# Patient Record
Sex: Male | Born: 1965 | Hispanic: No | Marital: Single | Smoking: Current every day smoker
Health system: Southern US, Community
[De-identification: ages and names within clinical notes are randomized; demographics above are authoritative.]

---

## 2008-01-13 ENCOUNTER — Emergency Department (HOSPITAL_COMMUNITY): Admission: EM | Admit: 2008-01-13 | Discharge: 2008-01-14 | Payer: Self-pay | Admitting: Emergency Medicine

## 2016-03-04 ENCOUNTER — Other Ambulatory Visit (HOSPITAL_COMMUNITY): Payer: Self-pay | Admitting: Radiology

## 2016-03-04 ENCOUNTER — Emergency Department (HOSPITAL_COMMUNITY): Payer: BLUE CROSS/BLUE SHIELD

## 2016-03-04 ENCOUNTER — Encounter (HOSPITAL_COMMUNITY): Payer: Self-pay

## 2016-03-04 ENCOUNTER — Inpatient Hospital Stay (HOSPITAL_COMMUNITY)
Admission: EM | Admit: 2016-03-04 | Discharge: 2016-03-09 | DRG: 029 | Disposition: A | Discharge status: Rehabilitation Facility | Payer: BLUE CROSS/BLUE SHIELD | Attending: Neurosurgery | Admitting: Neurosurgery

## 2016-03-04 DIAGNOSIS — M4802 Spinal stenosis, cervical region: Secondary | ICD-10-CM | POA: Diagnosis present

## 2016-03-04 DIAGNOSIS — Z419 Encounter for procedure for purposes other than remedying health state, unspecified: Secondary | ICD-10-CM

## 2016-03-04 DIAGNOSIS — F10129 Alcohol abuse with intoxication, unspecified: Secondary | ICD-10-CM | POA: Diagnosis present

## 2016-03-04 DIAGNOSIS — K592 Neurogenic bowel, not elsewhere classified: Secondary | ICD-10-CM | POA: Diagnosis present

## 2016-03-04 DIAGNOSIS — E871 Hypo-osmolality and hyponatremia: Secondary | ICD-10-CM

## 2016-03-04 DIAGNOSIS — S14129A Central cord syndrome at unspecified level of cervical spinal cord, initial encounter: Principal | ICD-10-CM

## 2016-03-04 DIAGNOSIS — S12600A Unspecified displaced fracture of seventh cervical vertebra, initial encounter for closed fracture: Secondary | ICD-10-CM | POA: Diagnosis present

## 2016-03-04 DIAGNOSIS — I10 Essential (primary) hypertension: Secondary | ICD-10-CM | POA: Diagnosis present

## 2016-03-04 DIAGNOSIS — M4712 Other spondylosis with myelopathy, cervical region: Secondary | ICD-10-CM | POA: Diagnosis present

## 2016-03-04 DIAGNOSIS — W108XXA Fall (on) (from) other stairs and steps, initial encounter: Secondary | ICD-10-CM | POA: Diagnosis present

## 2016-03-04 DIAGNOSIS — Z23 Encounter for immunization: Secondary | ICD-10-CM

## 2016-03-04 DIAGNOSIS — S12501A Unspecified nondisplaced fracture of sixth cervical vertebra, initial encounter for closed fracture: Secondary | ICD-10-CM | POA: Diagnosis present

## 2016-03-04 DIAGNOSIS — R1310 Dysphagia, unspecified: Secondary | ICD-10-CM | POA: Diagnosis not present

## 2016-03-04 DIAGNOSIS — Y92511 Restaurant or cafe as the place of occurrence of the external cause: Secondary | ICD-10-CM

## 2016-03-04 DIAGNOSIS — S14129S Central cord syndrome at unspecified level of cervical spinal cord, sequela: Secondary | ICD-10-CM | POA: Diagnosis not present

## 2016-03-04 DIAGNOSIS — S14109A Unspecified injury at unspecified level of cervical spinal cord, initial encounter: Secondary | ICD-10-CM | POA: Diagnosis present

## 2016-03-04 DIAGNOSIS — G8918 Other acute postprocedural pain: Secondary | ICD-10-CM

## 2016-03-04 DIAGNOSIS — Y905 Blood alcohol level of 100-119 mg/100 ml: Secondary | ICD-10-CM | POA: Diagnosis present

## 2016-03-04 DIAGNOSIS — S14109S Unspecified injury at unspecified level of cervical spinal cord, sequela: Secondary | ICD-10-CM | POA: Diagnosis not present

## 2016-03-04 DIAGNOSIS — F17210 Nicotine dependence, cigarettes, uncomplicated: Secondary | ICD-10-CM | POA: Diagnosis present

## 2016-03-04 DIAGNOSIS — S14109D Unspecified injury at unspecified level of cervical spinal cord, subsequent encounter: Secondary | ICD-10-CM | POA: Diagnosis not present

## 2016-03-04 DIAGNOSIS — N319 Neuromuscular dysfunction of bladder, unspecified: Secondary | ICD-10-CM

## 2016-03-04 LAB — CBC WITH DIFFERENTIAL/PLATELET
Basophils Absolute: 0 10*3/uL (ref 0.0–0.1)
Basophils Relative: 0 %
EOS ABS: 0 10*3/uL (ref 0.0–0.7)
Eosinophils Relative: 0 %
HCT: 42.6 % (ref 39.0–52.0)
HEMOGLOBIN: 14.6 g/dL (ref 13.0–17.0)
LYMPHS ABS: 1.9 10*3/uL (ref 0.7–4.0)
Lymphocytes Relative: 29 %
MCH: 30.2 pg (ref 26.0–34.0)
MCHC: 34.3 g/dL (ref 30.0–36.0)
MCV: 88 fL (ref 78.0–100.0)
MONO ABS: 0.4 10*3/uL (ref 0.1–1.0)
MONOS PCT: 6 %
NEUTROS PCT: 65 %
Neutro Abs: 4.2 10*3/uL (ref 1.7–7.7)
Platelets: 209 10*3/uL (ref 150–400)
RBC: 4.84 MIL/uL (ref 4.22–5.81)
RDW: 13.5 % (ref 11.5–15.5)
WBC: 6.5 10*3/uL (ref 4.0–10.5)

## 2016-03-04 LAB — CREATININE, SERUM
Creatinine, Ser: 0.66 mg/dL (ref 0.61–1.24)
GFR calc Af Amer: >60 mL/min (ref >60)
GFR calc non Af Amer: >60 mL/min (ref >60)

## 2016-03-04 LAB — CBC
HEMATOCRIT: 42.6 % (ref 39.0–52.0)
HEMOGLOBIN: 14.4 g/dL (ref 13.0–17.0)
MCH: 30.1 pg (ref 26.0–34.0)
MCHC: 33.8 g/dL (ref 30.0–36.0)
MCV: 89.1 fL (ref 78.0–100.0)
Platelets: 203 10*3/uL (ref 150–400)
RBC: 4.78 MIL/uL (ref 4.22–5.81)
RDW: 13.6 % (ref 11.5–15.5)
WBC: 6.6 10*3/uL (ref 4.0–10.5)

## 2016-03-04 LAB — COMPREHENSIVE METABOLIC PANEL
ALT: 19 U/L (ref 17–63)
AST: 22 U/L (ref 15–41)
Albumin: 4 g/dL (ref 3.5–5.0)
Alkaline Phosphatase: 41 U/L (ref 38–126)
Anion gap: 11 (ref 5–15)
BUN: 9 mg/dL (ref 6–20)
CO2: 21 mmol/L — ABNORMAL LOW (ref 22–32)
Calcium: 8.6 mg/dL — ABNORMAL LOW (ref 8.9–10.3)
Chloride: 100 mmol/L — ABNORMAL LOW (ref 101–111)
Creatinine, Ser: 0.7 mg/dL (ref 0.61–1.24)
GFR calc Af Amer: >60 mL/min (ref >60)
GFR calc non Af Amer: >60 mL/min (ref >60)
Glucose, Bld: 124 mg/dL — ABNORMAL HIGH (ref 65–99)
Potassium: 4.4 mmol/L (ref 3.5–5.1)
Sodium: 132 mmol/L — ABNORMAL LOW (ref 135–145)
Total Bilirubin: 0.6 mg/dL (ref 0.3–1.2)
Total Protein: 6.9 g/dL (ref 6.5–8.1)

## 2016-03-04 LAB — ETHANOL: Alcohol, Ethyl (B): 119 mg/dL — ABNORMAL HIGH (ref <5)

## 2016-03-04 LAB — RAPID URINE DRUG SCREEN, HOSP PERFORMED
Amphetamines: NOT DETECTED
Barbiturates: NOT DETECTED
Benzodiazepines: NOT DETECTED
COCAINE: NOT DETECTED
OPIATES: NOT DETECTED
Tetrahydrocannabinol: NOT DETECTED

## 2016-03-04 MED ORDER — SODIUM CHLORIDE 0.9 % IV SOLN
INTRAVENOUS | Status: DC
  Administered 2016-03-04 – 2016-03-05 (×2): via INTRAVENOUS
  Administered 2016-03-05: 1000 mL via INTRAVENOUS
  Administered 2016-03-07 – 2016-03-09 (×4): via INTRAVENOUS

## 2016-03-04 MED ORDER — ONDANSETRON HCL 4 MG/2ML IJ SOLN: 4.0000 mg | Freq: Four times a day (QID) | INTRAMUSCULAR | Status: DC | PRN

## 2016-03-04 MED ORDER — SENNA 8.6 MG PO TABS
1.0000 | ORAL_TABLET | Freq: Two times a day (BID) | ORAL | Status: DC
  Administered 2016-03-04 – 2016-03-09 (×9): 8.6 mg via ORAL
  Filled 2016-03-04 (×11): qty 1

## 2016-03-04 MED ORDER — DOCUSATE SODIUM 100 MG PO CAPS
100.0000 mg | ORAL_CAPSULE | Freq: Two times a day (BID) | ORAL | Status: DC
  Administered 2016-03-04 – 2016-03-09 (×9): 100 mg via ORAL
  Filled 2016-03-04 (×10): qty 1

## 2016-03-04 MED ORDER — HEPARIN SODIUM (PORCINE) 5000 UNIT/ML IJ SOLN
5000.0000 [IU] | Freq: Three times a day (TID) | INTRAMUSCULAR | Status: DC
  Administered 2016-03-04 – 2016-03-09 (×14): 5000 [IU] via SUBCUTANEOUS
  Filled 2016-03-04 (×14): qty 1

## 2016-03-04 MED ORDER — HYDROCODONE-ACETAMINOPHEN 5-325 MG PO TABS
1.0000 | ORAL_TABLET | ORAL | Status: DC | PRN
  Administered 2016-03-04 – 2016-03-05 (×3): 2 via ORAL
  Administered 2016-03-05: 1 via ORAL
  Administered 2016-03-05 – 2016-03-09 (×9): 2 via ORAL
  Filled 2016-03-04 (×8): qty 2
  Filled 2016-03-04: qty 1
  Filled 2016-03-04 (×5): qty 2

## 2016-03-04 MED ORDER — FENTANYL CITRATE (PF) 100 MCG/2ML IJ SOLN
100.0000 ug | Freq: Once | INTRAMUSCULAR | Status: AC
  Administered 2016-03-04: 100 ug via INTRAVENOUS
  Filled 2016-03-04: qty 2

## 2016-03-04 MED ORDER — GABAPENTIN 300 MG PO CAPS
300.0000 mg | ORAL_CAPSULE | Freq: Two times a day (BID) | ORAL | Status: DC
  Administered 2016-03-04 – 2016-03-09 (×11): 300 mg via ORAL
  Filled 2016-03-04 (×11): qty 1

## 2016-03-04 MED ORDER — TETANUS-DIPHTH-ACELL PERTUSSIS 5-2.5-18.5 LF-MCG/0.5 IM SUSP
0.5000 mL | Freq: Once | INTRAMUSCULAR | Status: AC
  Administered 2016-03-04: 0.5 mL via INTRAMUSCULAR
  Filled 2016-03-04: qty 0.5

## 2016-03-04 MED ORDER — ONDANSETRON HCL 4 MG PO TABS: 4.0000 mg | ORAL_TABLET | Freq: Four times a day (QID) | ORAL | Status: DC | PRN

## 2016-03-04 NOTE — ED Notes (Addendum)
Dr. Freida BusmanAllen at bedside Dr. Freida BusmanAllen observed the pt walking and stating "it felt like I was learning to walk again".

## 2016-03-04 NOTE — ED Provider Notes (Addendum)
[facility]-EMERGENCY DEPT Provider Note   CSN: [id number] Arrival date & time: 03/04/16  4098  First Provider Contact:  First MD Initiated Contact with Patient 03/04/16 0210   By signing my name below, I, [name], attest that this documentation has been prepared under the direction and in the presence of Tomasita Crumble, MD. Electronically Signed: [name], ED Scribe. 03/04/16. 2:28 AM.  History   Chief Complaint Chief Complaint  Patient presents with  . Fall  . Alcohol Intoxication   LEVEL 5 CAVEAT: HPI and ROS limited due to AMS.  HPI Comments: Brian Petty is a 50 y.o. male who presents to the Emergency Department by EMS complaining of bilateral hand pain s/p mechanical injury. Pt was at a bar downtown and fell off barstool onto steps. No LOC. Pt is also noted to have a laceration to his nose, a chipped tooth, and headache. Bleeding controlled. Per EMS, pt was unresponsive at the scene but pt is currently somewhat responsive to verbal stimuli at this time. Pt had alcohol tonight, but is unaware how much. Pt has no other complaints at this time. Denies any additional injuries.   The history is provided by the patient and the EMS personnel. No language interpreter was used.    History reviewed. No pertinent past medical history.  There are no active problems to display for this patient.   History reviewed. No pertinent surgical history.     Home Medications    Prior to Admission medications   Not on File    Family History History reviewed. No pertinent family history.  Social History Social History  Substance Use Topics  . Smoking status: Not on file  . Smokeless tobacco: Not on file  . Alcohol use Yes     Allergies   Review of patient's allergies indicates not on file.   Review of Systems Review of Systems  Unable to perform ROS: Mental status change   Physical Exam Updated Vital Signs BP 94/62   Pulse 77   Temp 97.4 F (36.3 C)   Resp 16   SpO2 100%    Physical Exam  Constitutional: He is oriented to person, place, and time. Vital signs are normal. He appears well-developed and well-nourished.  Non-toxic appearance. He does not appear ill. No distress.  C-collar in place. Clinically intoxicated.  HENT:  Head: Normocephalic and atraumatic.  Nose: Nose normal.  Mouth/Throat: Oropharynx is clear and moist. No oropharyngeal exudate.  Eyes: Conjunctivae and EOM are normal. Pupils are equal, round, and reactive to light. No scleral icterus.  Neck: Normal range of motion. Neck supple. No tracheal deviation, no edema, no erythema and normal range of motion present. No thyroid mass and no thyromegaly present.  Cardiovascular: Normal rate, regular rhythm, S1 normal, S2 normal, normal heart sounds, intact distal pulses and normal pulses.  Exam reveals no gallop and no friction rub.   No murmur heard. Pulmonary/Chest: Effort normal and breath sounds normal. No respiratory distress. He has no wheezes. He has no rhonchi. He has no rales.  Abdominal: Soft. Normal appearance and bowel sounds are normal. He exhibits no distension, no ascites and no mass. There is no hepatosplenomegaly. There is no tenderness. There is no rebound, no guarding and no CVA tenderness.  Musculoskeletal: Normal range of motion. He exhibits edema. He exhibits no tenderness.  Half centimeter, horizontal laceration to nasal bridge with edema.  Lymphadenopathy:    He has no cervical adenopathy.  Neurological: He is alert and oriented to person, place,  and time. He has normal strength. No cranial nerve deficit or sensory deficit.  Skin: Skin is warm, dry and intact. No petechiae and no rash noted. He is not diaphoretic. No erythema. No pallor.  Nursing note and vitals reviewed.    ED Treatments / Results  DIAGNOSTIC STUDIES: Oxygen Saturation is 99% on RA, normal by my interpretation.    COORDINATION OF CARE: 2:15 AM Discussed treatment plan with pt at bedside and pt agreed to  plan.  Labs (all labs ordered are listed, but only abnormal results are displayed) Labs Reviewed - No data to display  EKG  EKG Interpretation None       Radiology Ct Head Wo Contrast  Result Date: 03/04/2016 CLINICAL DATA:  Initial evaluation for acute trauma, fall. EXAM: CT HEAD WITHOUT CONTRAST CT MAXILLOFACIAL WITHOUT CONTRAST CT CERVICAL SPINE WITHOUT CONTRAST TECHNIQUE: Multidetector CT imaging of the head, cervical spine, and maxillofacial structures were performed using the standard protocol without intravenous contrast. Multiplanar CT image reconstructions of the cervical spine and maxillofacial structures were also generated. COMPARISON:  None. FINDINGS: CT HEAD FINDINGS Scalp soft tissues demonstrate no acute abnormality. Visualized globes and orbits within normal limits. Nasal bone fractures noted, better evaluated on concomitant maxillofacial CT. Scattered mucosal thickening within the ethmoidal air cells. Paranasal sinuses are otherwise clear. Trace opacity left mastoid air cells without associated fracture. Right mastoid air cells clear. Calvarium intact. Mild age-related cerebral atrophy. No acute intracranial hemorrhage or infarct identified. No mass lesion, midline shift, or mass effect. No hydrocephalus. No extra-axial fluid collection. CT MAXILLOFACIAL FINDINGS Soft tissue swelling present about the nose. No other appreciable soft tissue swelling about the face. Globes intact. No retro-orbital hematoma or other pathology. Bony orbits intact without evidence orbital floor fracture. Zygomatic arches intact. No acute maxillary fracture. Pterygoid plates intact. Comminuted bilateral displaced nasal bone fractures present. Fracture of the anterior nasal septum. Mandible intact. Mandibular condyles normally situated. No appreciable abnormality about the dentition. Scattered mucosal thickening within the ethmoidal air cells. Paranasal sinuses are otherwise clear. CT CERVICAL SPINE  FINDINGS The vertebral bodies are normally aligned with preservation of the normal cervical lordosis. Vertebral body heights are preserved. Normal C1-2 articulations are intact. Small prevertebral effusion is present. A linear lucency is seen traversing the right lateral mass of C7 (series 15, image 23). This extends through the superior articular facet (series 14, image 22). While this could conceivably reflect a small nutrient foramen, an acute nondisplaced fracture should be considered com mild particularly given the presence of the small prevertebral effusion. There is age-indeterminate lucency through a prominent bulky osteophyte at the anterior margin of C6, favored to be chronic. No other acute fracture. Moderate multilevel degenerative spondylolysis extending from the C4-5 through C6-7 levels. Visualized lung apices are clear. No other acute soft tissue abnormality within the neck. Vascular calcifications about the left carotid bifurcation. IMPRESSION: CT HEAD: No acute intracranial process identified. CT MAXILLOFACIAL: 1. Acute comminuted bilateral nasal bone fractures with associated fracture of the anterior nasal septum. 2. No other acute maxillofacial injury identified. CT CERVICAL SPINE: 1. Linear lucency traversing the right lateral mass of C7, extending to the superior articular facet. While this finding could conceivably reflect a small nutrient foramen, possible acute nondisplaced fracture should also be considered. Correlation with physical exam for possible pain in this location recommended. 2. Small prevertebral effusion. 3. No other definite acute traumatic injury within the cervical spine. 4. Moderate multilevel degenerative spondylolysis from C4-5 through C6-7. Electronically Signed   By:  Rise Mu M.D.   On: 03/04/2016 03:57   Ct Cervical Spine Wo Contrast  Result Date: 03/04/2016 CLINICAL DATA:  Initial evaluation for acute trauma, fall. EXAM: CT HEAD WITHOUT CONTRAST CT  MAXILLOFACIAL WITHOUT CONTRAST CT CERVICAL SPINE WITHOUT CONTRAST TECHNIQUE: Multidetector CT imaging of the head, cervical spine, and maxillofacial structures were performed using the standard protocol without intravenous contrast. Multiplanar CT image reconstructions of the cervical spine and maxillofacial structures were also generated. COMPARISON:  None. FINDINGS: CT HEAD FINDINGS Scalp soft tissues demonstrate no acute abnormality. Visualized globes and orbits within normal limits. Nasal bone fractures noted, better evaluated on concomitant maxillofacial CT. Scattered mucosal thickening within the ethmoidal air cells. Paranasal sinuses are otherwise clear. Trace opacity left mastoid air cells without associated fracture. Right mastoid air cells clear. Calvarium intact. Mild age-related cerebral atrophy. No acute intracranial hemorrhage or infarct identified. No mass lesion, midline shift, or mass effect. No hydrocephalus. No extra-axial fluid collection. CT MAXILLOFACIAL FINDINGS Soft tissue swelling present about the nose. No other appreciable soft tissue swelling about the face. Globes intact. No retro-orbital hematoma or other pathology. Bony orbits intact without evidence orbital floor fracture. Zygomatic arches intact. No acute maxillary fracture. Pterygoid plates intact. Comminuted bilateral displaced nasal bone fractures present. Fracture of the anterior nasal septum. Mandible intact. Mandibular condyles normally situated. No appreciable abnormality about the dentition. Scattered mucosal thickening within the ethmoidal air cells. Paranasal sinuses are otherwise clear. CT CERVICAL SPINE FINDINGS The vertebral bodies are normally aligned with preservation of the normal cervical lordosis. Vertebral body heights are preserved. Normal C1-2 articulations are intact. Small prevertebral effusion is present. A linear lucency is seen traversing the right lateral mass of C7 (series 15, image 23). This extends  through the superior articular facet (series 14, image 22). While this could conceivably reflect a small nutrient foramen, an acute nondisplaced fracture should be considered com mild particularly given the presence of the small prevertebral effusion. There is age-indeterminate lucency through a prominent bulky osteophyte at the anterior margin of C6, favored to be chronic. No other acute fracture. Moderate multilevel degenerative spondylolysis extending from the C4-5 through C6-7 levels. Visualized lung apices are clear. No other acute soft tissue abnormality within the neck. Vascular calcifications about the left carotid bifurcation. IMPRESSION: CT HEAD: No acute intracranial process identified. CT MAXILLOFACIAL: 1. Acute comminuted bilateral nasal bone fractures with associated fracture of the anterior nasal septum. 2. No other acute maxillofacial injury identified. CT CERVICAL SPINE: 1. Linear lucency traversing the right lateral mass of C7, extending to the superior articular facet. While this finding could conceivably reflect a small nutrient foramen, possible acute nondisplaced fracture should also be considered. Correlation with physical exam for possible pain in this location recommended. 2. Small prevertebral effusion. 3. No other definite acute traumatic injury within the cervical spine. 4. Moderate multilevel degenerative spondylolysis from C4-5 through C6-7. Electronically Signed   By: Rise Mu M.D.   On: 03/04/2016 03:57   Mr Cervical Spine Wo Contrast  Result Date: 03/04/2016 CLINICAL DATA:  Initial evaluation for acute trauma, fall. Possible cervical spine fracture. Patient also with numbness and burning sensation in hands. EXAM: MRI CERVICAL SPINE WITHOUT CONTRAST TECHNIQUE: Multiplanar, multisequence MR imaging of the cervical spine was performed. No intravenous contrast was administered. COMPARISON:  Prior CT from earlier same day. FINDINGS: Alignment: Vertebral bodies normally  aligned with preservation of the normal cervical lordosis. No listhesis or subluxation. Vertebrae: Vertebral body heights are preserved. There is subtle edema within the  right articular process/lateral mass of C6, confirming previously question fracture. No malalignment. Additionally, there is abnormal edema extending through the anterior aspect of the superior endplate of C6. This corresponds with a prominent lucency very bulky osteophyte seen on prior CT at this level. This also likely reflects an acute fracture. Possible involvement of the anterior aspect of the C5-6 disc (series 4, image 8). No other definite acute fracture. Signal intensity within the vertebral body bone marrow is otherwise normal. Cord: There is patchy abnormal cord signal abnormality within the cervical spinal cord at the level of C5-6, suspicious for possible small cord contusion in the setting of acute trauma. Posterior Fossa, vertebral arteries, paraspinal tissues: Abnormal prevertebral edema extending from C3 through T2 T2. Possible disruption of the anterior longitudinal ligament at the level of C6 ligamentous structures otherwise intact. Mild edema within the suboccipital soft tissues as well. Disc levels: C2-C3: Small central this bulge mildly indents the ventral thecal sac without significant stenosis. Superimposed bilateral uncovertebral hypertrophy. Foramina remain patent. C3-C4: Mild diffuse disc bulge with bilateral uncovertebral hypertrophy, slightly worse on the right. Moderate right with more mild left foraminal stenosis. Broad-based posterior disc osteophyte flattens the ventral thecal sac with resultant mild canal stenosis. C4-C5: Diffuse disc bulge with bilateral uncovertebral spurring and posterior osseous ridging. Posterior disc osteophyte indents the ventral thecal sac and abuts the cervical spinal cord. Resultant moderate canal stenosis. Moderate right with mild left foraminal narrowing. C5-C6: Diffuse degenerative disc  bulge with bilateral uncovertebral spurring and posterior osseous ridging central/ right paracentral disc osteophyte complex flattens and effaces the ventral thecal sac and abuts the cervical spinal cord. Mild cord flattening, particularly on the right. Again, there is question of subtle cord edema at this level. Moderate canal stenosis. Moderate bilateral foraminal narrowing, right worse than left. C6-C7: Broad-based posterior disc protrusion with bilateral uncovertebral spurring. Protruding disc flattens and effaces the ventral thecal sac results in fairly severe canal stenosis. Severe bilateral foraminal narrowing present as well. C7-T1: Bilateral uncovertebral hypertrophy with minimal disc bulge. No significant canal stenosis. Mild foraminal narrowing. Visualized upper thoracic spine demonstrates no acute abnormality. IMPRESSION: 1. Subtle edema within the right lateral mass/articular process of C6, confirming previously questioned fracture at this location. 2. Edema extending through the anterior aspect of the superior endplate of C6, highly suspicious for an acute fracture extending through a bulky anterior osteophyte at this level. Possible extension into the anterior inferior aspect of the C5-6 disc. 3. Subtle edema within the cervical spinal cord at the level of C5-6, concerning for cord contusion. 4. Question acute injury if not frank disruption of the anterior longitudinal ligament at the C6 level. No other evidence for acute ligamentous injury. Associated abnormal prevertebral edema. 5. Multilevel degenerative spondylolysis as above, most severe at C6-7 were there is severe canal and bilateral foraminal stenosis. Electronically Signed   By: Rise Mu M.D.   On: 03/04/2016 06:59   Dg Hand Complete Left  Result Date: 03/04/2016 CLINICAL DATA:  Status post fall off bar stool, with left hand pain. Initial encounter. EXAM: LEFT HAND - COMPLETE 3+ VIEW COMPARISON:  None. FINDINGS: There is no  evidence of fracture or dislocation. The joint spaces are preserved. The carpal rows are intact, and demonstrate normal alignment. The soft tissues are unremarkable in appearance. IMPRESSION: No evidence of fracture or dislocation. Electronically Signed   By: Roanna Raider M.D.   On: 03/04/2016 03:27   Dg Hand Complete Right  Result Date: 03/04/2016 CLINICAL DATA:  Status  post fall off bar stool, with right hand pain. Initial encounter. EXAM: RIGHT HAND - COMPLETE 3+ VIEW COMPARISON:  None. FINDINGS: There is no evidence of fracture or dislocation. The joint spaces are preserved. The carpal rows are intact, and demonstrate normal alignment. The soft tissues are unremarkable in appearance. IMPRESSION: No evidence of fracture or dislocation. Electronically Signed   By: Roanna Raider M.D.   On: 03/04/2016 03:27   Ct Maxillofacial Wo Contrast  Result Date: 03/04/2016 CLINICAL DATA:  Initial evaluation for acute trauma, fall. EXAM: CT HEAD WITHOUT CONTRAST CT MAXILLOFACIAL WITHOUT CONTRAST CT CERVICAL SPINE WITHOUT CONTRAST TECHNIQUE: Multidetector CT imaging of the head, cervical spine, and maxillofacial structures were performed using the standard protocol without intravenous contrast. Multiplanar CT image reconstructions of the cervical spine and maxillofacial structures were also generated. COMPARISON:  None. FINDINGS: CT HEAD FINDINGS Scalp soft tissues demonstrate no acute abnormality. Visualized globes and orbits within normal limits. Nasal bone fractures noted, better evaluated on concomitant maxillofacial CT. Scattered mucosal thickening within the ethmoidal air cells. Paranasal sinuses are otherwise clear. Trace opacity left mastoid air cells without associated fracture. Right mastoid air cells clear. Calvarium intact. Mild age-related cerebral atrophy. No acute intracranial hemorrhage or infarct identified. No mass lesion, midline shift, or mass effect. No hydrocephalus. No extra-axial fluid  collection. CT MAXILLOFACIAL FINDINGS Soft tissue swelling present about the nose. No other appreciable soft tissue swelling about the face. Globes intact. No retro-orbital hematoma or other pathology. Bony orbits intact without evidence orbital floor fracture. Zygomatic arches intact. No acute maxillary fracture. Pterygoid plates intact. Comminuted bilateral displaced nasal bone fractures present. Fracture of the anterior nasal septum. Mandible intact. Mandibular condyles normally situated. No appreciable abnormality about the dentition. Scattered mucosal thickening within the ethmoidal air cells. Paranasal sinuses are otherwise clear. CT CERVICAL SPINE FINDINGS The vertebral bodies are normally aligned with preservation of the normal cervical lordosis. Vertebral body heights are preserved. Normal C1-2 articulations are intact. Small prevertebral effusion is present. A linear lucency is seen traversing the right lateral mass of C7 (series 15, image 23). This extends through the superior articular facet (series 14, image 22). While this could conceivably reflect a small nutrient foramen, an acute nondisplaced fracture should be considered com mild particularly given the presence of the small prevertebral effusion. There is age-indeterminate lucency through a prominent bulky osteophyte at the anterior margin of C6, favored to be chronic. No other acute fracture. Moderate multilevel degenerative spondylolysis extending from the C4-5 through C6-7 levels. Visualized lung apices are clear. No other acute soft tissue abnormality within the neck. Vascular calcifications about the left carotid bifurcation. IMPRESSION: CT HEAD: No acute intracranial process identified. CT MAXILLOFACIAL: 1. Acute comminuted bilateral nasal bone fractures with associated fracture of the anterior nasal septum. 2. No other acute maxillofacial injury identified. CT CERVICAL SPINE: 1. Linear lucency traversing the right lateral mass of C7,  extending to the superior articular facet. While this finding could conceivably reflect a small nutrient foramen, possible acute nondisplaced fracture should also be considered. Correlation with physical exam for possible pain in this location recommended. 2. Small prevertebral effusion. 3. No other definite acute traumatic injury within the cervical spine. 4. Moderate multilevel degenerative spondylolysis from C4-5 through C6-7. Electronically Signed   By: Rise Mu M.D.   On: 03/04/2016 03:57    Procedures Procedures (including critical care time)  Medications Ordered in ED Medications  Tdap (BOOSTRIX) injection 0.5 mL (0.5 mLs Intramuscular Given 03/04/16 0333)  fentaNYL (SUBLIMAZE) injection  100 mcg (100 mcg Intravenous Given 03/04/16 [id number])     Initial Impression / Assessment and Plan / ED Course  I have reviewed the triage vital signs and the nursing notes.  Pertinent labs & imaging results that were available during my care of the patient were reviewed by me and considered in my medical decision making (see chart for details).  Clinical Course    Patient presents to the ED for fall.  CT scan reveals nasal bone fracture and possible C5-6 injury.  He continues to complain of pain in his hands and can not move his fingers.  He states he does not have control over them.  MRI will be ordered for further evaluation.   7:29 AM MRI reveals cord contusion with C6 fracture.  I spoke with Dr. Conchita ParisNundkumar who recs to continue cervical collar and to follow up with him in clinic for possible decompression surgery.  He states the patient does not need steroids as there is not much swelling.  He is currently clinically sober and can ambulate on his own without any assistance.  He states he will call an uber to get home.  Patient is safe for DC.  CRITICAL CARE Performed by: Tomasita CrumbleNI,Ricki Clack   Total critical care time: 40 minutes - cervical fracture with hand weaknes  Critical care time was  exclusive of separately billable procedures and treating other patients.  Critical care was necessary to treat or prevent imminent or life-threatening deterioration.  Critical care was time spent personally by me on the following activities: development of treatment plan with patient and/or surrogate as well as nursing, discussions with consultants, evaluation of patient's response to treatment, examination of patient, obtaining history from patient or surrogate, ordering and performing treatments and interventions, ordering and review of laboratory studies, ordering and review of radiographic studies, pulse oximetry and re-evaluation of patient's condition.   Final Clinical Impressions(s) / ED Diagnoses   Final diagnoses:  Contusion of cervical cord, initial encounter (HCC)  Closed nondisplaced fracture of sixth cervical vertebra, unspecified fracture morphology, initial encounter [facility](HCC)    New Prescriptions New Prescriptions   No medications on file    I personally performed the services described in this documentation, which was scribed in my presence. The recorded information has been reviewed and is accurate.       Tomasita CrumbleAdeleke Kendrix Orman, MD 03/04/16 [phone]  Patient is requesting for the medical record to reflect that he fell on the steps while walking away from the bar.  My sources from the EMS paramedic and the triage nurse informed me that the patient fell off of the bar stool.  I was not present on scene of this incident and can not speak to how the incident occurred.    Tomasita CrumbleAdeleke Wakeelah Solan, MD 04/12/16 [id number]

## 2016-03-04 NOTE — ED Notes (Signed)
While this Rn and Durwin RegesJackie RN were attempting to walk the pt the pt was needing moderate assistance with walking. Annice PihJackie RN had to help hold the pt up as he "felt like I am learning to walk again", the pts legs repeatedly gave out from underneath of him. The pt was also unable to grasp his clothing to help dress himself. He was unable to make a closed fist, or grab and squeeze this RNs hands. Dr. Freida BusmanAllen made aware.

## 2016-03-04 NOTE — H&P (Signed)
CC:  Chief Complaint  Patient presents with  . Fall  . Alcohol Intoxication    HPI: Brian Petty is a 50 y.o. male seen in the ED after being brought in after a fall. He was intoxicated, fell while going up some stairs and says he hit his face. He was unable to get himself up. In the ED he noted significant arm and hand weakness on both sides. The left had has gotten a little better, but right is still very weak. When he was stood up, his legs gave out, he had a hard time walking. He does also c/o pain around the right scapular region.  PMH: History reviewed. No pertinent past medical history.  PSH: History reviewed. No pertinent surgical history.  SH: Social History  Substance Use Topics  . Smoking status: Not on file  . Smokeless tobacco: Not on file  . Alcohol use Yes    MEDS: Prior to Admission medications   Not on File    ALLERGY: Allergies not on file  ROS: ROS  NEUROLOGIC EXAM: Awake, alert, oriented Memory and concentration grossly intact Speech fluent, appropriate CN grossly intact Motor exam: Upper Extremities Deltoid Bicep Tricep Grip  Right 5/5 5/5 4/5 1/5  Left 5/5 5/5 4+/5 3/5   Lower Extremity IP Quad PF DF EHL  Right 5/5 5/5 5/5 5/5 5/5  Left 5/5 5/5 5/5 5/5 5/5   Sensation grossly intact to LT  [facility] demonstrates an oblique fracture through the right C7 lateral mass/articular process. There is also fracture through the anterior C6-7 osteophyte.   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 There does appear to be moderate to severe stenosis at C6-7, as well as at C4-5 and C5-6. In addition, there is foraminal stenosis, R>L at these levels.   IMPRESSION: - 50 y.o. male likely central cord syndrome in the setting of underlying cervical spondylosis. He does have unilateral right C6-7 facet/lat mass fracture and possible injury to the ALL although I don't believe this by itself would  require operative stabilization, however with the stenosis we may consider early ACDF.  PLAN: - Would maintain cervical immobilization in Aspen collar - Will get PT/OT - May discuss surgical decompression/stabilization

## 2016-03-04 NOTE — ED Notes (Signed)
c-collar removed

## 2016-03-04 NOTE — ED Notes (Signed)
Pts next neuro check is at 1200. Pt does not have arm or leg drift, but the pts right arm is weaker. Pt is unable to close his hands or grasp anything. Pt is have difficulty walking with both legs. Pt states "it feels like I am learning how to walk again". Sensation is present bilaterally. Pt has an Biochemist, clinicalAspen collar on. Pt is able to use a urinal. Pt is alert and oriented X 4. Pt does have an IV in his left forearm with no fluids running.

## 2016-03-04 NOTE — ED Provider Notes (Signed)
Called to see patient at request of nursing due to patient being unsteady on his gait. Patient also was noted to have trouble gripping things with his hands. Patient seen today and diagnosed with C6 fracture. When out and related the patient, he was very unsteady on his feet and knees did buckle. I will reconsult neurosurgery for admission   Lorre NickAnthony Keslie Gritz, MD 03/04/16 [phone]

## 2016-03-04 NOTE — ED Notes (Signed)
Aspen collar placed; informed patient to leave c-collar in place

## 2016-03-04 NOTE — ED Triage Notes (Signed)
[place] and [place] off barstool on to steps. Has laceartion to nose, chipped tooth, complans of head and hand pain. Per PD unresponsive on scene, here pt responsive to verbal stimuli. vss

## 2016-03-04 NOTE — ED Notes (Signed)
Attempted to call report

## 2016-03-05 ENCOUNTER — Other Ambulatory Visit: Payer: Self-pay | Admitting: Neurosurgery

## 2016-03-05 LAB — PROTIME-INR
INR: 0.98
Prothrombin Time: 13 seconds (ref 11.4–15.2)

## 2016-03-05 NOTE — Progress Notes (Signed)
Patient surgical consent completed. Placed in paper chart. Melody Savidge, [place] ScrapeSarah E

## 2016-03-05 NOTE — Progress Notes (Signed)
No issues overnight. Pt reports slightly worsened left arm and hand strength  EXAM:  BP (!) 161/100 (BP Location: Right Arm)   Pulse 70   Temp 98 F (36.7 C) (Oral)   Resp 18   Ht 5\' 11"  (1.803 m)   Wt 72.6 kg (160 lb)   SpO2 100%   BMI 22.32 kg/m   Awake, alert, oriented  Speech fluent, appropriate  CN grossly intact  5/5 BLE 1-2/5 distal LUE, 1/5 distal RUE  IMPRESSION:  50 y.o. male with central cord syndrome with underlying mod-severe cervical stenosis at C4-C7  PLAN: - will plan on early decompression via ACDF C4-7  I have reviewed the situation with the patient. Options for treatment were discussed, including continued conservative treatment v early decompression. I explained to him that I believe there is a better chance for recovery with early decompression. Risks were discussed including nerve/spinal cord injury, dysphagia, dysphonia, neck hematoma, and need for additional surgery. All questions were answered. He is willing to proceed.

## 2016-03-06 ENCOUNTER — Encounter (HOSPITAL_COMMUNITY)
Admission: EM | Disposition: A | Discharge status: Rehabilitation Facility | Payer: Self-pay | Source: Home / Self Care | Attending: Neurosurgery

## 2016-03-06 ENCOUNTER — Inpatient Hospital Stay (HOSPITAL_COMMUNITY): Payer: BLUE CROSS/BLUE SHIELD | Admitting: Certified Registered Nurse Anesthetist

## 2016-03-06 ENCOUNTER — Inpatient Hospital Stay (HOSPITAL_COMMUNITY): Payer: BLUE CROSS/BLUE SHIELD

## 2016-03-06 HISTORY — PX: ANTERIOR CERVICAL DECOMP/DISCECTOMY FUSION: SHX1161

## 2016-03-06 LAB — SURGICAL PCR SCREEN
MRSA, PCR: NEGATIVE
Staphylococcus aureus: NEGATIVE

## 2016-03-06 SURGERY — ANTERIOR CERVICAL DECOMPRESSION/DISCECTOMY FUSION 3 LEVELS
Anesthesia: General

## 2016-03-06 MED ORDER — SODIUM CHLORIDE 0.9 % IJ SOLN
INTRAMUSCULAR | Status: AC
  Filled 2016-03-06: qty 10

## 2016-03-06 MED ORDER — PROPOFOL 10 MG/ML IV BOLUS
INTRAVENOUS | Status: AC
  Filled 2016-03-06: qty 20

## 2016-03-06 MED ORDER — PROMETHAZINE HCL 25 MG/ML IJ SOLN: 6.2500 mg | INTRAMUSCULAR | Status: DC | PRN

## 2016-03-06 MED ORDER — PHENYLEPHRINE HCL 10 MG/ML IJ SOLN
INTRAVENOUS | Status: DC | PRN
  Administered 2016-03-06: 20 ug/min via INTRAVENOUS

## 2016-03-06 MED ORDER — DIAZEPAM 5 MG PO TABS
5.0000 mg | ORAL_TABLET | Freq: Four times a day (QID) | ORAL | Status: DC | PRN
  Administered 2016-03-06 – 2016-03-08 (×5): 5 mg via ORAL
  Filled 2016-03-06 (×5): qty 1

## 2016-03-06 MED ORDER — THROMBIN 5000 UNITS EX SOLR
OROMUCOSAL | Status: DC | PRN
  Administered 2016-03-06: 14:00:00 via TOPICAL

## 2016-03-06 MED ORDER — BACITRACIN-NEOMYCIN-POLYMYXIN OINTMENT TUBE
TOPICAL_OINTMENT | CUTANEOUS | Status: DC | PRN
  Administered 2016-03-06: 1 via TOPICAL

## 2016-03-06 MED ORDER — NEOSTIGMINE METHYLSULFATE 5 MG/5ML IV SOSY
PREFILLED_SYRINGE | INTRAVENOUS | Status: AC
  Filled 2016-03-06: qty 5

## 2016-03-06 MED ORDER — THROMBIN 20000 UNITS EX SOLR
CUTANEOUS | Status: DC | PRN
  Administered 2016-03-06: 14:00:00 via TOPICAL

## 2016-03-06 MED ORDER — DEXAMETHASONE SODIUM PHOSPHATE 10 MG/ML IJ SOLN
INTRAMUSCULAR | Status: DC | PRN
  Administered 2016-03-06: 10 mg via INTRAVENOUS

## 2016-03-06 MED ORDER — 0.9 % SODIUM CHLORIDE (POUR BTL) OPTIME
TOPICAL | Status: DC | PRN
  Administered 2016-03-06: 1000 mL

## 2016-03-06 MED ORDER — PROPOFOL 10 MG/ML IV BOLUS
INTRAVENOUS | Status: DC | PRN
  Administered 2016-03-06: 170 mg via INTRAVENOUS

## 2016-03-06 MED ORDER — BUPIVACAINE HCL 0.5 % IJ SOLN
INTRAMUSCULAR | Status: DC | PRN
  Administered 2016-03-06: 10 mL

## 2016-03-06 MED ORDER — MIDAZOLAM HCL 2 MG/2ML IJ SOLN: 0.5000 mg | Freq: Once | INTRAMUSCULAR | Status: DC | PRN

## 2016-03-06 MED ORDER — FENTANYL CITRATE (PF) 250 MCG/5ML IJ SOLN
INTRAMUSCULAR | Status: AC
  Filled 2016-03-06: qty 5

## 2016-03-06 MED ORDER — CEFAZOLIN SODIUM-DEXTROSE 2-3 GM-% IV SOLR
INTRAVENOUS | Status: DC | PRN
  Administered 2016-03-06: 2 g via INTRAVENOUS

## 2016-03-06 MED ORDER — GLYCOPYRROLATE 0.2 MG/ML IJ SOLN
INTRAMUSCULAR | Status: DC | PRN
  Administered 2016-03-06: 0.2 mg via INTRAVENOUS
  Administered 2016-03-06: 0.4 mg via INTRAVENOUS

## 2016-03-06 MED ORDER — FENTANYL CITRATE (PF) 100 MCG/2ML IJ SOLN
INTRAMUSCULAR | Status: DC | PRN
  Administered 2016-03-06 (×3): 50 ug via INTRAVENOUS

## 2016-03-06 MED ORDER — DEXAMETHASONE SODIUM PHOSPHATE 10 MG/ML IJ SOLN
INTRAMUSCULAR | Status: AC
  Filled 2016-03-06: qty 1

## 2016-03-06 MED ORDER — NEOSTIGMINE METHYLSULFATE 10 MG/10ML IV SOLN
INTRAVENOUS | Status: DC | PRN
  Administered 2016-03-06: 3 mg via INTRAVENOUS

## 2016-03-06 MED ORDER — LACTATED RINGERS IV SOLN
INTRAVENOUS | Status: DC | PRN
  Administered 2016-03-06 (×3): via INTRAVENOUS

## 2016-03-06 MED ORDER — OXYMETAZOLINE HCL 0.05 % NA SOLN
NASAL | Status: DC | PRN
  Administered 2016-03-06 (×2): 1 via NASAL

## 2016-03-06 MED ORDER — ROCURONIUM BROMIDE 100 MG/10ML IV SOLN
INTRAVENOUS | Status: DC | PRN
  Administered 2016-03-06 (×2): 50 mg via INTRAVENOUS

## 2016-03-06 MED ORDER — EPHEDRINE SULFATE 50 MG/ML IJ SOLN
INTRAMUSCULAR | Status: AC
  Filled 2016-03-06: qty 1

## 2016-03-06 MED ORDER — MIDAZOLAM HCL 5 MG/5ML IJ SOLN
INTRAMUSCULAR | Status: DC | PRN
  Administered 2016-03-06: 2 mg via INTRAVENOUS

## 2016-03-06 MED ORDER — GLYCOPYRROLATE 0.2 MG/ML IV SOSY
PREFILLED_SYRINGE | INTRAVENOUS | Status: AC
  Filled 2016-03-06: qty 3

## 2016-03-06 MED ORDER — LIDOCAINE-EPINEPHRINE 1 %-1:100000 IJ SOLN
INTRAMUSCULAR | Status: DC | PRN
  Administered 2016-03-06: 10 mL

## 2016-03-06 MED ORDER — KETOROLAC TROMETHAMINE 15 MG/ML IJ SOLN
15.0000 mg | Freq: Four times a day (QID) | INTRAMUSCULAR | Status: AC
  Administered 2016-03-06 – 2016-03-07 (×3): 15 mg via INTRAVENOUS
  Filled 2016-03-06 (×3): qty 1

## 2016-03-06 MED ORDER — MIDAZOLAM HCL 2 MG/2ML IJ SOLN
INTRAMUSCULAR | Status: AC
  Filled 2016-03-06: qty 2

## 2016-03-06 MED ORDER — LIDOCAINE HCL (CARDIAC) 20 MG/ML IV SOLN
INTRAVENOUS | Status: DC | PRN
  Administered 2016-03-06: 80 mg via INTRATRACHEAL

## 2016-03-06 MED ORDER — HYDROMORPHONE HCL 1 MG/ML IJ SOLN: 0.2500 mg | INTRAMUSCULAR | Status: DC | PRN

## 2016-03-06 MED ORDER — CEFAZOLIN SODIUM-DEXTROSE 2-4 GM/100ML-% IV SOLN
INTRAVENOUS | Status: AC
  Filled 2016-03-06: qty 100

## 2016-03-06 MED ORDER — ALBUMIN HUMAN 5 % IV SOLN
INTRAVENOUS | Status: DC | PRN
  Administered 2016-03-06: 14:00:00 via INTRAVENOUS

## 2016-03-06 MED ORDER — MEPERIDINE HCL 25 MG/ML IJ SOLN: 6.2500 mg | INTRAMUSCULAR | Status: DC | PRN

## 2016-03-06 MED ORDER — ONDANSETRON HCL 4 MG/2ML IJ SOLN
INTRAMUSCULAR | Status: DC | PRN
  Administered 2016-03-06: 4 mg via INTRAVENOUS

## 2016-03-06 MED ORDER — PHENYLEPHRINE HCL 10 MG/ML IJ SOLN
INTRAMUSCULAR | Status: DC | PRN
  Administered 2016-03-06 (×2): 80 ug via INTRAVENOUS

## 2016-03-06 MED ORDER — SODIUM CHLORIDE 0.9 % IR SOLN
Status: DC | PRN
  Administered 2016-03-06: 14:00:00

## 2016-03-06 SURGICAL SUPPLY — 67 items
BAG DECANTER FOR FLEXI CONT (MISCELLANEOUS) ×3 IMPLANT
BENZOIN TINCTURE PRP APPL 2/3 (GAUZE/BANDAGES/DRESSINGS) IMPLANT
BLADE CLIPPER SURG (BLADE) IMPLANT
BLADE SURG 11 STRL SS (BLADE) ×3 IMPLANT
BLADE ULTRA TIP 2M (BLADE) IMPLANT
BUR MATCHSTICK NEURO 3.0 LAGG (BURR) ×3 IMPLANT
CAGE EXPANSE 8X6X6 (Cage) ×9 IMPLANT
CAGE PEEK 6X14X11 (Cage) ×3 IMPLANT
CAGE PEEK 7X14X11 (Cage) ×4 IMPLANT
CAGE SPNL 11X14X7XRADOPQ (Cage) ×2 IMPLANT
CANISTER SUCT 3000ML PPV (MISCELLANEOUS) ×3 IMPLANT
CLOSURE WOUND 1/2 X4 (GAUZE/BANDAGES/DRESSINGS)
CONT SPEC 4OZ CLIKSEAL STRL BL (MISCELLANEOUS) ×6 IMPLANT
DECANTER SPIKE VIAL GLASS SM (MISCELLANEOUS) ×3 IMPLANT
DRAIN CHANNEL 10M FLAT 3/4 FLT (DRAIN) IMPLANT
DRAPE C-ARM 42X72 X-RAY (DRAPES) ×6 IMPLANT
DRAPE HALF SHEET 40X57 (DRAPES) IMPLANT
DRAPE LAPAROTOMY 100X72 PEDS (DRAPES) ×3 IMPLANT
DRAPE MICROSCOPE LEICA (MISCELLANEOUS) ×3 IMPLANT
DRAPE POUCH INSTRU U-SHP 10X18 (DRAPES) ×3 IMPLANT
DRSG OPSITE 4X5.5 SM (GAUZE/BANDAGES/DRESSINGS) ×6 IMPLANT
DRSG OPSITE POSTOP 3X4 (GAUZE/BANDAGES/DRESSINGS) ×6 IMPLANT
DRSG OPSITE POSTOP 4X6 (GAUZE/BANDAGES/DRESSINGS) ×3 IMPLANT
DURAPREP 6ML APPLICATOR 50/CS (WOUND CARE) ×3 IMPLANT
ELECT COATED BLADE 2.86 ST (ELECTRODE) ×3 IMPLANT
ELECT REM PT RETURN 9FT ADLT (ELECTROSURGICAL) ×3
ELECTRODE REM PT RTRN 9FT ADLT (ELECTROSURGICAL) ×1 IMPLANT
EVACUATOR SILICONE 100CC (DRAIN) IMPLANT
GAUZE SPONGE 4X4 16PLY XRAY LF (GAUZE/BANDAGES/DRESSINGS) IMPLANT
GLOVE BIOGEL PI IND STRL 7.5 (GLOVE) ×1 IMPLANT
GLOVE BIOGEL PI INDICATOR 7.5 (GLOVE) ×2
GLOVE ECLIPSE 7.0 STRL STRAW (GLOVE) ×3 IMPLANT
GLOVE EXAM NITRILE LRG STRL (GLOVE) IMPLANT
GLOVE EXAM NITRILE MD LF STRL (GLOVE) IMPLANT
GLOVE EXAM NITRILE XL STR (GLOVE) IMPLANT
GLOVE EXAM NITRILE XS STR PU (GLOVE) IMPLANT
GOWN STRL REUS W/ TWL LRG LVL3 (GOWN DISPOSABLE) ×2 IMPLANT
GOWN STRL REUS W/ TWL XL LVL3 (GOWN DISPOSABLE) IMPLANT
GOWN STRL REUS W/TWL 2XL LVL3 (GOWN DISPOSABLE) IMPLANT
GOWN STRL REUS W/TWL LRG LVL3 (GOWN DISPOSABLE) ×4
GOWN STRL REUS W/TWL XL LVL3 (GOWN DISPOSABLE)
HEMOSTAT POWDER KIT SURGIFOAM (HEMOSTASIS) ×3 IMPLANT
KIT BASIN OR (CUSTOM PROCEDURE TRAY) ×3 IMPLANT
KIT ROOM TURNOVER OR (KITS) ×3 IMPLANT
LIQUID BAND (GAUZE/BANDAGES/DRESSINGS) ×3 IMPLANT
NEEDLE HYPO 25X1 1.5 SAFETY (NEEDLE) ×3 IMPLANT
NEEDLE SPNL 22GX3.5 QUINCKE BK (NEEDLE) ×3 IMPLANT
NS IRRIG 1000ML POUR BTL (IV SOLUTION) ×3 IMPLANT
PACK LAMINECTOMY NEURO (CUSTOM PROCEDURE TRAY) ×3 IMPLANT
PAD ARMBOARD 7.5X6 YLW CONV (MISCELLANEOUS) ×9 IMPLANT
PLATE 3 62.5XLCK NS SPNE CVD (Plate) ×1 IMPLANT
PLATE 3 ATLANTIS TRANS (Plate) ×2 IMPLANT
RUBBERBAND STERILE (MISCELLANEOUS) ×6 IMPLANT
SCREW 4.0X13 (Screw) ×8 IMPLANT
SCREW BN 13X4XSLF DRL FXANG (Screw) ×4 IMPLANT
SCREW SELF TAP VAR 4.0X13 (Screw) ×12 IMPLANT
SPONGE INTESTINAL PEANUT (DISPOSABLE) ×3 IMPLANT
SPONGE SURGIFOAM ABS GEL 100 (HEMOSTASIS) ×3 IMPLANT
STRIP CLOSURE SKIN 1/2X4 (GAUZE/BANDAGES/DRESSINGS) IMPLANT
SUT ETHILON 3 0 FSL (SUTURE) IMPLANT
SUT VIC AB 3-0 SH 8-18 (SUTURE) ×6 IMPLANT
SUT VICRYL 3-0 RB1 18 ABS (SUTURE) ×6 IMPLANT
TAPE CLOTH 3X10 TAN LF (GAUZE/BANDAGES/DRESSINGS) ×3 IMPLANT
TAPE UMBILICAL 1/8 X36 TWILL (MISCELLANEOUS) ×6 IMPLANT
TOWEL OR 17X24 6PK STRL BLUE (TOWEL DISPOSABLE) ×3 IMPLANT
TOWEL OR 17X26 10 PK STRL BLUE (TOWEL DISPOSABLE) ×3 IMPLANT
WATER STERILE IRR 1000ML POUR (IV SOLUTION) ×3 IMPLANT

## 2016-03-06 NOTE — Op Note (Signed)
PREOP DIAGNOSIS:  1. Cervical spondylosis with myelopathy, C4-5, C5-6, C6-7 2. Central cord syndrome  POSTOP DIAGNOSIS: Same  PROCEDURE: 1. Discectomy at C4-5, C5-6, C6-7 for decompression of spinal cord and exiting nerve roots  2. Placement of intervertebral biomechanical device, [employer] PTC PEEK lordotic 16m @ C4-5, 625m@ C5-6, 10m55m C6-7 3. Placement of anterior instrumentation consisting of interbody plate and screws spanning C4-C7 4. Use of morselized bone allograft  5. Arthrodesis C4-5, C5-6, C6-7, anterior interbody technique  6. Use of intraoperative microscope  SURGEON: Dr. NeeConsuella LoseD  ASSISTANT: Dr. HenKristeen MissD  ANESTHESIA: General Endotracheal  EBL: 150cc  SPECIMENS: None  DRAINS: None  COMPLICATIONS: none immediate  CONDITION: hemodynamically stable to PACU  HISTORY: Brian Petty a 50 39o. seen in the ED after falling while intoxicated. He presented with severe distal BUE weakness including minimal grip strength bilaterally. He also had significant imbalance with inability to walk. MRI demonstrated underlying stenosis at C4-C7, with cord contusion at C5-6, in addition to fracture at C6-7 and possible disruption of the ALL. Early surgical decompression was therefore indicated. Alternatives, risks and benefits were reviewed in detail with the patient. After all questions were answered, consent was obtained.  PROCEDURE IN DETAIL: The patient was brought to the operating room and transferred to the operative table. After induction of general anesthesia, the patient was positioned on the operative table in the supine position with all pressure points meticulously padded. The skin of the neck was then prepped and draped in the usual sterile fashion.  After timeout was conducted, the skin was infiltrated with local anesthetic. Skin incision was then made sharply and Bovie electrocautery was used to dissect the subcutaneous tissue until the platysma was  identified. The platysma was then divided and undermined. The sternocleidomastoid muscle was then identified and, utilizing natural fascial planes in the neck, the prevertebral fascia was identified and the carotid sheath was retracted laterally and the trachea and esophagus retracted medially. Again using fluoroscopy, the correct disc spaces were identified. Bovie electrocautery was used to dissect in the subperiosteal plane and elevate the bilateral longus coli muscles. I did not some hematoma within the prevertebral space at C5-6. There was also a significant amount of edema fluid within the prevertebral space as well.  Self-retaining retractors were then placed. At this point, the microscope was draped and brought into the field, and the remainder of the case was done under the microscope using microdissecting technique.  The C4-5 disc space was incised sharply and rongeurs were use to initially complete a discectomy. There were large anterior osteophytes which were flattened with the drill. The high-speed drill was then used to complete discectomy until the posterior annulus was identified and removed and the posterior longitudinal ligament was identified. Using a nerve hook, the PLL was elevated, and Kerrison rongeurs were used to remove the posterior longitudinal ligament and the ventral thecal sac was identified. Using a combination of curettes and rongeurs, complete decompression of the thecal sac and exiting nerve roots at this level was completed, and verified using micro-nerve hook. There did appear to be significant PLL hypertrophy and posterior osteophytosis at this level.  At this point, a 10mm16mterbody cage was sized and packed with morcellized bone allograft. This was then inserted and tapped into place.  Attention was then turned to the C5-6 level. In a similar fashion, discectomy was completed initially with curettes and rongeurs, and completed with the drill. The PLL was again identified,  elevated  and incised. Using Kerrison rongeurs, decompression of the spinal cord and exiting roots was completed and confirmed with a dissector. Again noted was thickened PLL with posterior osteophytes.  A 40m interbody cage was then sized and filled with bone allograft, and tapped into place. Position of the interbody devices was then confirmed with fluoroscopy.  Attention was then turned to the C6-7 level. In a similar fashion, discectomy was completed initially with curettes and rongeurs, and completed with the drill. The PLL was again identified, elevated and incised. Using Kerrison rongeurs, decompression of the spinal cord and exiting roots was completed.  A 651minterbody cage was then sized and filled with bone allograft, and tapped into place. Position of the interbody devices was then confirmed with fluoroscopy.  After placement of the intervertebral devices, the anterior cervical plate was selected, and placed across the interspaces. Using a high-speed drill, the cortex of the cervical vertebral bodies was punctured, and screws inserted in the C4, C5, C6, and C7 levels. Final fluoroscopic images in AP and lateral projections were taken to confirm good hardware placement.  At this point, after all counts were verified to be correct, meticulous hemostasis was secured using a combination of bipolar electrocautery and passive hemostatics. The platysma muscle was then closed using interrupted 3-0 Vicryl sutures, and the skin was closed with a interrupted subcuticular stitch. Dermabond was then applied and sterile dressings were placed and the drapes removed.  The patient tolerated the procedure well and was extubated in the room and taken to the postanesthesia care unit in stable condition.  At the end of the case all sponge, needle, instrument, and cottonoid counts were correct.

## 2016-03-06 NOTE — Care Management Note (Signed)
Case Management Note  Patient Details  Name: Ivin BootyMichael Kervin MRN: [id number] Date of Birth: 11-16-1965  Subjective/Objective:   Pt admitted with fall and spinal cord injury C1-7. He is from home alone.                  Action/Plan: Plan is for patient to go to OR today. CM following for d/c needs.   Expected Discharge Date:  03/07/16               Expected Discharge Plan:     In-House Referral:     Discharge planning Services     Post Acute Care Choice:    Choice offered to:     DME Arranged:    DME Agency:     HH Arranged:    HH Agency:     Status of Service:  In process, will continue to follow  If discussed at Long Length of Stay Meetings, dates discussed:    Additional Comments:  Kermit BaloKelli F Orena Cavazos, RN 03/06/2016, 12:15 PM

## 2016-03-06 NOTE — Progress Notes (Signed)
Pt left unit for surgery with Dr. Conchita ParisNundkumar. Brian Petty

## 2016-03-06 NOTE — Anesthesia Postprocedure Evaluation (Signed)
Anesthesia Post Note  Patient: Brian Petty  Procedure(s) Performed: Procedure(s) (LRB): ANTERIOR CERVICAL DECOMPRESSION/DISCECTOMY FUSION CERVICAL FOUR- CERVICAL FIVE, CERVICAL FIVE- CERVICAL SIX, CERVICAL SIX- CERVICAL SEVEN (N/A)  Patient location during evaluation: PACU Anesthesia Type: General Level of consciousness: awake and alert, oriented and patient cooperative Pain management: pain level controlled Vital Signs Assessment: post-procedure vital signs reviewed and stable Respiratory status: spontaneous breathing, nonlabored ventilation, respiratory function stable and patient connected to nasal cannula oxygen Cardiovascular status: blood pressure returned to baseline and stable Postop Assessment: no signs of nausea or vomiting Anesthetic complications: no Comments: Arm and leg strength improved from pre-op state    Last Vitals:  Vitals:   03/06/16 1645 03/06/16 1700  BP: (!) 150/91   Pulse: 85 89  Resp: (!) 39 (!) 24  Temp: 37.1 C     Last Pain:  Vitals:   03/06/16 1140  TempSrc:   PainSc: 4                  Anjolie Majer,E. Mildreth Reek

## 2016-03-06 NOTE — Anesthesia Preprocedure Evaluation (Addendum)
Anesthesia Evaluation  Patient identified by MRN, date of birth, ID band Patient awake    Reviewed: Allergy & Precautions, NPO status , Patient's Chart, lab work & pertinent test results  History of Anesthesia Complications Negative for: history of anesthetic complications  Airway Mallampati: II  TM Distance: >3 FB Neck ROM: Full    Dental  (+) Chipped, Dental Advisory Given   Pulmonary Current Smoker,    breath sounds clear to auscultation       Cardiovascular (-) anginanegative cardio ROS   Rhythm:Regular Rate:Normal     Neuro/Psych central cord syndrome in the setting of underlying cervical spondylosis, unilateral right C6-7 facet/lat mass fracture, possible injury to the ALL     GI/Hepatic negative GI ROS, Neg liver ROS,   Endo/Other  negative endocrine ROS  Renal/GU negative Renal ROS     Musculoskeletal   Abdominal   Peds  Hematology negative hematology ROS (+)   Anesthesia Other Findings   Reproductive/Obstetrics                            Anesthesia Physical Anesthesia Plan  ASA: III  Anesthesia Plan: General   Post-op Pain Management:    Induction: Intravenous  Airway Management Planned: Awake Intubation Planned, Video Laryngoscope Planned and Fiberoptic Intubation Planned  Additional Equipment:   Intra-op Plan:   Post-operative Plan: Extubation in OR  Informed Consent: I have reviewed the patients History and Physical, chart, labs and discussed the procedure including the risks, benefits and alternatives for the proposed anesthesia with the patient or authorized representative who has indicated his/her understanding and acceptance.   Dental advisory given  Plan Discussed with: CRNA and Surgeon  Anesthesia Plan Comments: (Plan routine monitors, GETA with Awake VideoGlide/fiberoptic intubation)        Anesthesia Quick Evaluation

## 2016-03-06 NOTE — Anesthesia Procedure Notes (Signed)
Procedure Name: Awake intubation Performed by: Jairo BenJACKSON, CARSWELL Pre-anesthesia Checklist: Patient identified, Emergency Drugs available, Suction available, Patient being monitored and Timeout performed Patient Re-evaluated:Patient Re-evaluated prior to inductionOxygen Delivery Method: Circle system utilized Preoxygenation: Pre-oxygenation with 100% oxygen Laryngoscope Size: Glidescope and 4 Grade View: Grade I Tube type: Oral Tube size: 7.5 mm Airway Equipment and Method: Video-laryngoscopy Placement Confirmation: ETT inserted through vocal cords under direct vision,  positive ETCO2 and breath sounds checked- equal and bilateral Secured at: 23 cm Tube secured with: Tape Dental Injury: Teeth and Oropharynx as per pre-operative assessment  Difficulty Due To: Difficult Airway- due to cervical collar and Difficult Airway-  due to neck instability

## 2016-03-06 NOTE — Progress Notes (Signed)
No issues overnight. Pt cont to have neck and bilateral burning/shooting arm pain  EXAM:  BP (!) 149/88 (BP Location: Right Arm)   Pulse 68   Temp 98.1 F (36.7 C) (Oral)   Resp 18   Ht 5\' 11"  (1.803 m)   Wt 72.6 kg (160 lb)   SpO2 100%   BMI 22.32 kg/m   Awake, alert, oriented  Speech fluent, appropriate  CN grossly intact  Distal BUE weakness unchanged  IMPRESSION:  50 y.o. male with central cord syndrome and underlying spondylosis/stenosis  PLAN: - Will proceed with C4-7 ACDF today  I again reviewed the surgical indications, alternatives, risks, and benefits with the patient. All questions were answered and he remains eager to proceed.

## 2016-03-06 NOTE — Transfer of Care (Signed)
Immediate Anesthesia Transfer of Care Note  Patient: Brian Petty  Procedure(s) Performed: Procedure(s) with comments: ANTERIOR CERVICAL DECOMPRESSION/DISCECTOMY FUSION CERVICAL FOUR- CERVICAL FIVE, CERVICAL FIVE- CERVICAL SIX, CERVICAL SIX- CERVICAL SEVEN (N/A) - ANTERIOR CERVICAL DECOMPRESSION/DISCECTOMY FUSION C4-C5, C5-C6, C6-C7  Patient Location: PACU  Anesthesia Type:General  Level of Consciousness: awake and alert   Airway & Oxygen Therapy: Patient Spontanous Breathing and Patient connected to nasal cannula oxygen  Post-op Assessment: Report given to RN and Post -op Vital signs reviewed and stable  Post vital signs: Reviewed and stable  Last Vitals:  Vitals:   03/06/16 0941 03/06/16 1645  BP: (!) 175/97 (!) 150/91  Pulse: 69 85  Resp: 20 (!) 39  Temp: 36.7 C 37.1 C    Last Pain:  Vitals:   03/06/16 1140  TempSrc:   PainSc: 4       Patients Stated Pain Goal: 0 (03/06/16 0800)  Complications: No apparent anesthesia complications

## 2016-03-07 ENCOUNTER — Encounter (HOSPITAL_COMMUNITY): Payer: Self-pay | Admitting: Neurosurgery

## 2016-03-07 DIAGNOSIS — E871 Hypo-osmolality and hyponatremia: Secondary | ICD-10-CM

## 2016-03-07 DIAGNOSIS — S14109A Unspecified injury at unspecified level of cervical spinal cord, initial encounter: Secondary | ICD-10-CM

## 2016-03-07 DIAGNOSIS — K592 Neurogenic bowel, not elsewhere classified: Secondary | ICD-10-CM

## 2016-03-07 DIAGNOSIS — N319 Neuromuscular dysfunction of bladder, unspecified: Secondary | ICD-10-CM

## 2016-03-07 DIAGNOSIS — S14129D Central cord syndrome at unspecified level of cervical spinal cord, subsequent encounter: Secondary | ICD-10-CM

## 2016-03-07 DIAGNOSIS — S14109D Unspecified injury at unspecified level of cervical spinal cord, subsequent encounter: Secondary | ICD-10-CM

## 2016-03-07 DIAGNOSIS — Z419 Encounter for procedure for purposes other than remedying health state, unspecified: Secondary | ICD-10-CM

## 2016-03-07 DIAGNOSIS — S12501A Unspecified nondisplaced fracture of sixth cervical vertebra, initial encounter for closed fracture: Secondary | ICD-10-CM

## 2016-03-07 DIAGNOSIS — I10 Essential (primary) hypertension: Secondary | ICD-10-CM

## 2016-03-07 DIAGNOSIS — G8918 Other acute postprocedural pain: Secondary | ICD-10-CM

## 2016-03-07 DIAGNOSIS — S14129A Central cord syndrome at unspecified level of cervical spinal cord, initial encounter: Secondary | ICD-10-CM

## 2016-03-07 NOTE — Progress Notes (Signed)
Rehab Admissions Coordinator Note:  Patient was screened by Clois DupesBoyette, Rahel Carlton Godwin for appropriateness for an Inpatient Acute Rehab Consult per PT recommendation.  At this time, we are recommending Inpatient Rehab consult. Please place order.  Clois DupesBoyette, Marbella Markgraf Godwin 03/07/2016, 12:51 PM  I can be reached at [phone].

## 2016-03-07 NOTE — Progress Notes (Signed)
Occupational Therapy Evaluation Patient Details Name: Brian Petty MRN: [id number] DOB: [birth year] Today's Date: 03/07/2016    History of Present Illness Brian Petty a 50 y.o.maleseen in the ED after being brought in after a fall. He was intoxicated, fell while going up some stairs and says he hit his face. He was unable to get himself up. In the ED he noted significant arm and hand weakness on both sides. The left had has gotten a little better, but right is still very weak. When he was stood up, his legs gave out, he had a hard time walking. He does also c/o pain around the right scapular region. Pt s/p ADCF/discectomy at C4-5, C6-6, C6-7.   Clinical Impression   PTA, pt independent with ADL and mobility and worked as a Armed forces logistics/support/administrative officer. Pt presents with deficits listed below and is an excellent CIR candidate. Pt is very motivated to maximize his functional level of independence. Brian Petty pt can reach mod I level by participating in CIR. Will follow acutely to address established goals.     Follow Up Recommendations  CIR    Equipment Recommendations  3 in 1 bedside comode    Recommendations for Other Services Rehab consult     Precautions / Restrictions Precautions Precautions: Cervical;Fall Precaution Comments: handout provided Required Braces or Orthoses: Cervical Brace Cervical Brace: Hard collar Restrictions Weight Bearing Restrictions: No      Mobility Bed Mobility Overal bed mobility: Needs Assistance Bed Mobility: Rolling;Sidelying to Sit Rolling: Min assist Sidelying to sit: Min assist       General bed mobility comments: v/c's for rolling to the R and using R elbow and L hand to push up, minA for trunk elevation, increased time  Transfers Overall transfer level: Needs assistance Equipment used: 2 person hand held assist Transfers: Sit to/from Stand Sit to Stand: Min assist;+2 physical assistance Stand pivot transfers: Min assist;+2 physical  assistance       General transfer comment: pt unsteady upon initital stand requiring minA to maintain balance    Balance Overall balance assessment: Needs assistance Sitting-balance support: Single extremity supported;No upper extremity supported;Feet supported Sitting balance-Leahy Scale: fair     Standing balance support: Bilateral upper extremity supported Standing balance-Leahy Scale: Poor                              ADL Overall ADL's : Needs assistance/impaired Eating/Feeding: Minimal assistance;Sitting Eating/Feeding Details (indicate cue type and reason): difficulty managing utensils, keeping food on plate, opening packages Grooming: Moderate assistance;Sitting   Upper Body Bathing: Minimal assitance;Sitting   Lower Body Bathing: Minimal assistance;Sit to/from stand   Upper Body Dressing : Moderate assistance;Sitting   Lower Body Dressing: Moderate assistance;Sit to/from stand Lower Body Dressing Details (indicate cue type and reason): difficulty with grasping waistband with R hand Toilet Transfer: Minimal assistance;Ambulation;Comfort height toilet   Toileting- Clothing Manipulation and Hygiene: Minimal assistance       Functional mobility during ADLs: Minimal assistance (unsteady with ambulation/scissors at times) General ADL Comments: Issued pt tubing to build up utensils and toothbrush. Pt asking about return of UE function.      Vision Vision Assessment?: No apparent visual deficits   Perception     Praxis Praxis Praxis tested?: Within functional limits    Pertinent Vitals/Pain Pain Assessment: 0-10 Pain Score: 8  Pain Location: arms Pain Descriptors / Indicators: Shooting Pain Intervention(s): Limited activity within patient's tolerance     Hand Dominance Right  Extremity/Trunk Assessment Upper Extremity Assessment Upper Extremity Assessment: Defer to OT evaluation RUE Deficits / Details: 3+/5 shoulder flexion/ER/Abd/elbow flex.  1/5 elbow extension. 3/5 wrist ext/supination/pronation. minimal movement of R thumb and trace finger flexion. no active finger extion noted. Attmepting to use R hand as functional gross assist. Hypersensitive to touch RUE Sensation: decreased light touch (hypersensitivity. numbness in fingertips) RUE Coordination: decreased fine motor;decreased gross motor LUE Deficits / Details: stronger than R. shoulder, elbow 3+/5. wrist extension/supination/pronation 3/5. increased movement of L thumb and able to oppose index and middle fingers. 2/5 finger extension. Using L hand as dominant hand. Able to complete full composite flexion (3/5).Extensor lag of digits @ 30 degress. Minimal movement of intrinisics. Less complaints of hypersensitivity in LUE. numbness in fingertips. Able to hold tubing for toothbrush but has difficulty with strength LUE Sensation: decreased light touch (hypersensitivity throughout arm/numbness in fingertips) LUE Coordination: decreased fine motor;decreased gross motor   Lower Extremity Assessment Lower Extremity Assessment: Generalized weakness   Cervical / Trunk Assessment Cervical / Trunk Assessment:  (neck surgery)   Communication Communication Communication: No difficulties   Cognition Arousal/Alertness: Awake/alert Behavior During Therapy: WFL for tasks assessed/performed Overall Cognitive Status:  (appears WFL. Will further assess)      Pt with +LOC. Assess with MOCA               General Comments   Pt very appreciative    Exercises Exercises: Other exercises Other Exercises Other Exercises: given squeeze ball for B hand strengthening   Shoulder Instructions      Home Living Family/patient expects to be discharged to:: Private residence Living Arrangements: Alone Available Help at Discharge: Friend(s);Available PRN/intermittently Type of Home: Apartment Home Access: Stairs to enter Entrance Stairs-Number of Steps: 2 flights  Entrance Stairs-Rails:  Right;Left;Can reach both Home Layout: One level     Bathroom Shower/Tub: Tub/shower unit;Curtain Shower/tub characteristics: Industrial/product designerCurtain   Bathroom Accessibility: Yes How Accessible: Accessible via walker Home Equipment: None          Prior Functioning/Environment Level of Independence: Independent        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; drives    OT Diagnosis: Generalized weakness;Acute pain;Paresis   OT Problem List: Decreased strength;Decreased range of motion;Decreased activity tolerance;Impaired balance (sitting and/or standing);Decreased coordination;Decreased safety awareness;Decreased knowledge of use of DME or AE;Decreased knowledge of precautions;Impaired sensation;Impaired tone;Impaired UE functional use;Pain   OT Treatment/Interventions: Self-care/ADL training;Therapeutic exercise;Neuromuscular education;DME and/or AE instruction;Therapeutic activities;Patient/family education;Balance training    OT Goals(Current goals can be found in the care plan section) Acute Rehab OT Goals Patient Stated Goal: rehab then home OT Goal Formulation: With patient Time For Goal Achievement: 03/21/16 Potential to Achieve Goals: Good ADL Goals Pt Will Perform Eating: with modified independence;with adaptive utensils Pt Will Perform Grooming: with modified independence;with adaptive equipment;standing Pt Will Perform Upper Body Bathing: with modified independence;sitting Pt Will Perform Lower Body Bathing: with modified independence;sitting/lateral leans Pt Will Perform Upper Body Dressing: with modified independence;sitting Pt Will Transfer to Toilet: with modified independence;ambulating;bedside commode Pt/caregiver will Perform Home Exercise Program: Increased strength;Increased ROM;With theraputty;With Supervision;With written HEP provided;Both right and left upper extremity  OT Frequency: Min 3X/week   Barriers to D/C:            Co-evaluation PT/OT/SLP  Co-Evaluation/Treatment: Yes Reason for Co-Treatment: Complexity of the patient's impairments (multi-system involvement) PT goals addressed during session: Mobility/safety with mobility OT goals addressed during session: ADL's and self-care      End of Session Equipment Utilized During Treatment:  Gait belt Nurse Communication: Mobility status;Patient requests pain meds  Activity Tolerance: Patient tolerated treatment well Patient left: in chair;with call bell/phone within reach;with chair alarm set   Time: 1020-1058 OT Time Calculation (min): 38 min Charges:  OT General Charges $OT Visit: 1 Procedure OT Evaluation $OT Eval Moderate Complexity: 1 Procedure OT Treatments $Self Care/Home Management : 8-22 mins G-Codes:    Tresia Revolorio,HILLARY 03/07/2016, 12:56 PM   [facility] Borna Wessinger, OTR/L  [phone] 03/07/2016

## 2016-03-07 NOTE — Consult Note (Addendum)
Physical Medicine and Rehabilitation Consult   Reason for Consult: Central cord syndrome Referring Physician: Dr. Conchita ParisNundkumar    HPI: Brian Petty is a 50 y.o. male who was admitted on 03/04/16 after fall and was unresponsive at scene. He was responsive to stimuli on evaluation by EMS and had reports of sharp burning pin BUE, bilateral hand weakness and unsteady gait.  MRI spine with edema C6 with suspicion for fracture, cord contusion C5/6 with question of ligamentous injury and multilevel spondylosis most severe at C6-7. He was evaluated by Dr. 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  He  underwent ACDF C4-7 on 03/06/16  He reports that his hand are feeling a little better. Lives alone but has friends who has handicapped accessible home and can provide supervision.    Review of Systems  Constitutional: Negative for malaise/fatigue.  HENT: Negative for hearing loss.        Stuffy nose  Eyes: Negative for blurred vision and double vision.  Respiratory: Negative for cough and shortness of breath.   Cardiovascular: Negative for chest pain and palpitations.  Gastrointestinal: Positive for constipation. Negative for heartburn, nausea and vomiting.  Genitourinary: Negative for dysuria and urgency.  Musculoskeletal: Positive for neck pain. Negative for myalgias.  Neurological: Positive for tingling, focal weakness and weakness. Negative for dizziness, speech change and headaches.  Psychiatric/Behavioral: Negative for memory loss. The patient is not nervous/anxious and does not have insomnia.   All other systems reviewed and are negative.   History reviewed. No pertinent past medical history.    Past Surgical History:  Procedure Laterality Date  . ANTERIOR CERVICAL DECOMP/DISCECTOMY FUSION N/A 03/06/2016   Procedure: ANTERIOR CERVICAL DECOMPRESSION/DISCECTOMY FUSION CERVICAL FOUR- CERVICAL  FIVE, CERVICAL FIVE- CERVICAL SIX, CERVICAL SIX- CERVICAL SEVEN;  Surgeon: Lisbeth RenshawNeelesh Nundkumar, MD;  Location: [facility];  Service: Neurosurgery;  Laterality: N/A;  ANTERIOR CERVICAL DECOMPRESSION/DISCECTOMY FUSION C4-C5, C5-C6, C6-C7    History reviewed. No pertinent family history.    Social History:  Divorced. 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 He reports that he has been smoking Cigarettes.  He has been smoking about 0.50 packs per day. He has never used smokeless tobacco. He reports that he drinks alcohol--couple of glasses of wine.  He reports that he does not use dru   Allergies  Allergen Reactions  . No Known Allergies    Medications Prior to Admission  Medication Sig Dispense Refill  . ibuprofen (ADVIL,MOTRIN) 200 MG tablet Take 600 mg by mouth every 6 (six) hours as needed (pain).    Marland Kitchen. OVER THE COUNTER MEDICATION Place 1 drop into both eyes 3 (three) times daily as needed (dry eyes). 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; drives Functional Status:  Mobility: Bed Mobility Overal bed mobility: Needs Assistance Bed Mobility: Rolling, Sidelying to Sit Rolling: Min assist Sidelying to sit: Min assist General bed mobility comments: v/c's for rolling to the R and using R elbow and L hand to push up, minA for  trunk elevation, increased time Transfers Overall transfer level: Needs assistance Equipment used: 2 person hand held  assist Transfers: Sit to/from Stand Sit to Stand: Min assist, +2 physical assistance Stand pivot transfers: Min assist, +2 physical assistance General transfer comment: pt unsteady upon initital stand requiring minA to maintain balance Ambulation/Gait Ambulation/Gait assistance: Min assist, +2 physical assistance Ambulation Distance (Feet): 60 Feet Assistive device: 2 person hand held assist Gait Pattern/deviations: Step-through pattern, Decreased stride length General Gait Details: pt very unsteady with R lateral leaning requiring assist to prevent fall. Pt with crossover gait pattern especially with LOB in effort to catch self. unable to use UEs to hold RW or to prevent fall at this time Gait velocity: decreased Gait velocity interpretation: Below normal speed for age/gender    ADL: ADL Overall ADL's : Needs assistance/impaired Eating/Feeding: Minimal assistance, Sitting Eating/Feeding Details (indicate cue type and reason): difficulty managing utensils, keeping food on plate, opening packages Grooming: Moderate assistance, Sitting Upper Body Bathing: Minimal assitance, Sitting Lower Body Bathing: Minimal assistance, Sit to/from stand Upper Body Dressing : Moderate assistance, Sitting Lower Body Dressing: Moderate assistance, Sit to/from stand Lower Body Dressing Details (indicate cue type and reason): difficulty with grasping waistband with R hand Toilet Transfer: Minimal assistance, Ambulation, Comfort height toilet Toileting- Clothing Manipulation and Hygiene: Minimal assistance Functional mobility during ADLs: Minimal assistance (unsteady with ambulation/scissors at times) General ADL Comments: Issued pt tubing to build up utensils and toothbrush. Pt asking about return of UE function.   Cognition: Cognition Overall Cognitive Status:  (appears WFL. Will further assess) Orientation Level: Oriented X4 Cognition Arousal/Alertness: Awake/alert Behavior During Therapy: WFL for  tasks assessed/performed Overall Cognitive Status:  (appears WFL. Will further assess)  Blood pressure (!) 152/87, pulse 62, temperature 97.9 F (36.6 C), temperature source Oral, resp. rate 16, height 5\' 11"  (1.803 m), weight 72.6 kg (160 lb), SpO2 100 %. Physical Exam  Vitals reviewed. Constitutional: He is oriented to person, place, and time. He appears well-developed and well-nourished.  HENT:  Right Ear: External ear normal.  Left Ear: External ear normal.  Eyes: EOM are normal. Right eye exhibits no discharge. Left eye exhibits no discharge.  Neck:  C-collar in place  Cardiovascular: Normal rate and regular rhythm.   Respiratory: Effort normal and breath sounds normal.  GI: Soft. Bowel sounds are normal.  Musculoskeletal: He exhibits no edema or tenderness.  Neurological: He is alert and oriented to person, place, and time.  Motor: B/l UE: Should abduction, elbow flexion/extension wrist extension 4/5, hand grip 2+/5 B/l LE: 5/5 proximal to distal DTRs symmetric Sensation intact to light touch  Skin: Skin is warm and dry.  Surgical dressing c/d/i  Psychiatric: He has a normal mood and affect. His behavior is normal. Thought content normal.    No results found for this or any previous visit (from the past 24 hour(s)). Dg Cervical Spine 2-3 Views  Result Date: 03/06/2016 CLINICAL DATA:  ACDF C4-C7 EXAM: DG C-ARM 61-120 MIN; CERVICAL SPINE - 2-3 VIEW COMPARISON:  None. FINDINGS: Changes of 3 level ACDF from C4-C7 noted on these intraoperative spot images. Normal alignment. No hardware complicating feature. IMPRESSION: C4-C7 ACDF.  No complicating feature. Electronically Signed   By: Charlett NoseKevin  Dover M.D.   On: 03/06/2016 16:42   Dg C-arm 1-60 Min  Result Date: 03/06/2016 CLINICAL DATA:  ACDF C4-C7 EXAM: DG C-ARM 61-120 MIN; CERVICAL SPINE - 2-3 VIEW COMPARISON:  None. FINDINGS: Changes of 3 level ACDF from C4-C7 noted on these intraoperative spot  images. Normal alignment. No hardware  complicating feature. IMPRESSION: C4-C7 ACDF.  No complicating feature. Electronically Signed   By: Charlett Nose M.D.   On: 03/06/2016 16:42    Assessment/Plan: Diagnosis: Central cord syndrome Labs and images independently reviewed.  Records reviewed and summated above.     Psych: psychology consult for adjustment to disability for pt and family     Spasticity: may develop spasticity. Manage spasticity only if indicated     (pain, hygiene, prevention of contractures, functional impairment).     Pain Management:  control with oral medications if possible     Bladder:  would expect development of neurogenic bladder as when spasticity starts to develop. May confirm this with serial PVRs to r/o retention/atonic bladder. Will continue in/out clean catherization. Implement bladder program . Encourage self I&O cath training vs     indwelling foley if possible to improve mobility, reduce infection, and     increase safety     Bowel: Continue stress ulcer ppx..  Implement mechanical and chemical bowel program and care     training with scheduled suppository 30 min to 1 hour after meals to utilize     gastrocolic and colorectal reflexes.   1. Does the need for close, 24 hr/day medical supervision in concert with the patient's rehab needs make it unreasonable for this patient to be served in a less intensive setting? Yes 2. Co-Morbidities requiring supervision/potential complications: HTN (monitor and provide prns in accordance with increased physical exertion and pain), hyponatremia (cont to monitor, treat if necessary, without correcting too aggressively), post-op pain (Biofeedback training with therapies to help reduce reliance on opiate pain medications, monitor pain control during therapies, and sedation at rest and titrate to maximum efficacy to ensure participation and gains in therapies) 3. Due to bladder management, bowel management, safety, skin/wound care, disease management, medication  administration, pain management and patient education, does the patient require 24 hr/day rehab nursing? Yes 4. Does the patient require coordinated care of a physician, rehab nurse, PT (1-2 hrs/day, 5 days/week), OT (1-2 hrs/day, 5 days/week) and SLP (1-2 hrs/day, 5 days/week) to address physical and functional deficits in the context of the above medical diagnosis(es)? Yes Addressing deficits in the following areas: balance, endurance, locomotion, strength, transferring, bathing, dressing, feeding, grooming, toileting, swallowing and psychosocial support 5. Can the patient actively participate in an intensive therapy program of at least 3 hrs of therapy per day at least 5 days per week? Yes 6. The potential for patient to make measurable gains while on inpatient rehab is excellent 7. Anticipated functional outcomes upon discharge from inpatient rehab are modified independent and supervision  with PT, supervision and min assist with OT, independent and modified independent with SLP. 8. Estimated rehab length of stay to reach the above functional goals is: 14-18 days. 9. Does the patient have adequate social supports and living environment to accommodate these discharge functional goals? Potentially 10. Anticipated D/C setting: Home 11. Anticipated post D/C treatments: HH therapy and Home excercise program 12. Overall Rehab/Functional Prognosis: good  RECOMMENDATIONS: This patient's condition is appropriate for continued rehabilitative care in the following setting: CIR Patient has agreed to participate in recommended program. Yes Note that insurance prior authorization may be required for reimbursement for recommended care.  Comment: Rehab Admissions Coordinator to follow up.  Maryla Morrow, MD 03/07/2016

## 2016-03-07 NOTE — Progress Notes (Signed)
No issues overnight. Pt has minimal neck soreness, tolerating diet. No hoarseness. Reports improved stability while walking. No change in subjective arm/hand strength  EXAM:  BP 115/69 (BP Location: Right Arm)   Pulse 72   Temp 98 F (36.7 C) (Oral)   Resp 16   Ht 5\' 11"  (1.803 m)   Wt 72.6 kg (160 lb)   SpO2 100%   BMI 22.32 kg/m   Awake, alert, oriented  Speech fluent, appropriate  CN grossly intact  5/5 BLE 2/5 bilateral grip/intrinsics, good proximal strength  IMPRESSION:  50 y.o. male POD#1 s/p C4-7 ACDF for stenosis/central cord syndrome. Doing well  PLAN: - Will consult PMR for possible CIR

## 2016-03-07 NOTE — Evaluation (Signed)
Physical Therapy Evaluation Patient Details Name: Brian Petty MRN: [id number] DOB: 1966-06-21 Today's Date: 03/07/2016   History of Present Illness  Brian Petty a 50 y.o.maleseen in the ED after being brought in after a fall. He was intoxicated, fell while going up some stairs and says he hit his face. He was unable to get himself up. In the ED he noted significant arm and hand weakness on both sides. The left had has gotten a little better, but right is still very weak. When he was stood up, his legs gave out, he had a hard time walking. He does also c/o pain around the right scapular region. Pt s/p ADCF/discectomy at C4-5, C6-6, C6-7.  Clinical Impression  Pt admitted with above. Pt presenting with significant UE weakness and impaired sensation. Pt at increased falls risk and requires assist for all mobility and ADLs at this time. Pt to strongly benefit from CIR upon d/c as pt is motivated and demo's excellent rehab potential to achieve safe mod I level of function for safe transition home.    Follow Up Recommendations CIR;Supervision/Assistance - 24 hour    Equipment Recommendations   (TBD)    Recommendations for Other Services Rehab consult     Precautions / Restrictions Precautions Precautions: Cervical;Fall Precaution Comments: handout provided Required Braces or Orthoses: Cervical Brace Cervical Brace: Hard collar Restrictions Weight Bearing Restrictions: No      Mobility  Bed Mobility Overal bed mobility: Needs Assistance Bed Mobility: Rolling;Sidelying to Sit Rolling: Min assist Sidelying to sit: Min assist       General bed mobility comments: v/c's for rolling to the R and using R elbow and L hand to push up, minA for trunk elevation, increased time  Transfers Overall transfer level: Needs assistance Equipment used: 2 person hand held assist Transfers: Sit to/from Stand Sit to Stand: Min assist;+2 physical assistance         General transfer comment:  pt unsteady upon initital stand requiring minA to maintain balance  Ambulation/Gait Ambulation/Gait assistance: Min assist;+2 physical assistance Ambulation Distance (Feet): 60 Feet Assistive device: 2 person hand held assist Gait Pattern/deviations: Step-through pattern;Decreased stride length Gait velocity: decreased Gait velocity interpretation: Below normal speed for age/gender General Gait Details: pt very unsteady with R lateral leaning requiring assist to prevent fall. Pt with crossover gait pattern especially with LOB in effort to catch self. unable to use UEs to hold RW or to prevent fall at this time  Stairs            Wheelchair Mobility    Modified Rankin (Stroke Patients Only)       Balance Overall balance assessment: Needs assistance Sitting-balance support: Single extremity supported;No upper extremity supported;Feet supported Sitting balance-Leahy Scale: Poor     Standing balance support: Bilateral upper extremity supported Standing balance-Leahy Scale: Poor                               Pertinent Vitals/Pain Pain Assessment: 0-10 Pain Score: 8  Pain Location: arms Pain Descriptors / Indicators: Shooting Pain Intervention(s): Limited activity within patient's tolerance    Home Living Family/patient expects to be discharged to:: Private residence Living Arrangements: Alone Available Help at Discharge: Friend(s);Available PRN/intermittently Type of Home: Apartment Home Access: Stairs to enter Entrance Stairs-Rails: Right;Left;Can reach both Entrance Stairs-Number of Steps: 2 flights  Home Layout: One level Home Equipment: None      Prior Function Level of Independence: Independent  Comments: works as Marine scientistpharmacy manager; drives     Hand Dominance   Dominant Hand: Right    Extremity/Trunk Assessment   Upper Extremity Assessment: Defer to OT evaluation           Lower Extremity Assessment: Generalized weakness       Cervical / Trunk Assessment:  (neck surgery)  Communication   Communication: No difficulties  Cognition Arousal/Alertness: Awake/alert Behavior During Therapy: WFL for tasks assessed/performed                        General Comments      Exercises        Assessment/Plan    PT Assessment Patient needs continued PT services  PT Diagnosis Difficulty walking;Abnormality of gait;Generalized weakness;Acute pain   PT Problem List Decreased strength;Decreased range of motion;Decreased activity tolerance;Decreased balance;Decreased mobility  PT Treatment Interventions DME instruction;Gait training;Stair training;Functional mobility training;Therapeutic activities;Therapeutic exercise;Balance training   PT Goals (Current goals can be found in the Care Plan section) Acute Rehab PT Goals Patient Stated Goal: rehab then home PT Goal Formulation: With patient Time For Goal Achievement: 03/21/16 Potential to Achieve Goals: Good    Frequency Min 5X/week   Barriers to discharge Decreased caregiver support lives alone    Co-evaluation PT/OT/SLP Co-Evaluation/Treatment: Yes Reason for Co-Treatment: Complexity of the patient's impairments (multi-system involvement) PT goals addressed during session: Mobility/safety with mobility         End of Session Equipment Utilized During Treatment: Gait belt Activity Tolerance: Patient tolerated treatment well Patient left: in chair;with chair alarm set;with call bell/phone within reach (with OT) Nurse Communication: Mobility status         Time: 1020-1054 PT Time Calculation (min) (ACUTE ONLY): 34 min   Charges:   PT Evaluation $PT Eval Moderate Complexity: 1 Procedure     PT G CodesMarcene Brawn:        Viaan Knippenberg Marie 03/07/2016, 12:22 PM   Lewis ShockAshly Dominick Morella, PT, DPT Pager #: [phone] Office #: [phone]

## 2016-03-08 NOTE — Progress Notes (Signed)
Physical Therapy Treatment Patient Details Name: Brian Petty DOB: September 23, 1965 Today's Date: 03/08/2016    History of Present Illness Brian Petty a 50 y.o.maleseen in the ED after being brought in after a fall. He was intoxicated, fell while going up some stairs and says he hit his face. He was unable to get himself up. In the ED he noted significant arm and hand weakness on both sides. The left had has gotten a little better, but right is still very weak. When he was stood up, his legs gave out, he had a hard time walking. He does also c/o pain around the right scapular region. Pt s/p ADCF/discectomy at C4-5, C6-6, C6-7.    PT Comments    Pt progressing steadily.  Emphasized warm up flexion/extension exercise for Upper and LE's with graded resistance and gait training with work on control.   Follow Up Recommendations  CIR;Supervision/Assistance - 24 hour     Equipment Recommendations       Recommendations for Other Services Rehab consult     Precautions / Restrictions Precautions Precautions: Cervical;Fall Required Braces or Orthoses: Cervical Brace Cervical Brace: Hard collar    Mobility  Bed Mobility               General bed mobility comments: up in the chair  Transfers Overall transfer level: Needs assistance   Transfers: Sit to/from Stand Sit to Stand: Min assist         General transfer comment: Initial unsteadiness easy to manage with some control at hips.  Ambulation/Gait Ambulation/Gait assistance: Min assist Ambulation Distance (Feet): 200 Feet Assistive device: 1 person hand held assist (iv pole) Gait Pattern/deviations: Step-through pattern Gait velocity: decreased Gait velocity interpretation: Below normal speed for age/gender General Gait Details: Unsteady gait with narrow BOS.  Worked on heel/toe with had the result of helping widen BOS some of the time.  Still scissored and buckled softly at times.   Stairs             Wheelchair Mobility    Modified Rankin (Stroke Patients Only)       Balance Overall balance assessment: Needs assistance Sitting-balance support: No upper extremity supported Sitting balance-Leahy Scale: Fair       Standing balance-Leahy Scale: Poor Standing balance comment: still relying on some assistive device for stability                    Cognition Arousal/Alertness: Awake/alert Behavior During Therapy: WFL for tasks assessed/performed Overall Cognitive Status: Within Functional Limits for tasks assessed                      Exercises Other Exercises Other Exercises: grossly flexion/extension exercise to hips/knees (marching) and gross flexion /extension to Bil UE with graded resistance x10    General Comments        Pertinent Vitals/Pain Pain Assessment: Faces Faces Pain Scale: Hurts even more Pain Location: arms Pain Descriptors / Indicators: Grimacing;Discomfort Pain Intervention(s): Monitored during session;Repositioned    Home Living                      Prior Function            PT Goals (current goals can now be found in the care plan section) Acute Rehab PT Goals Patient Stated Goal: rehab then home PT Goal Formulation: With patient Time For Goal Achievement: 03/21/16 Potential to Achieve Goals: Good Progress towards PT goals: Progressing toward  goals    Frequency  Min 5X/week    PT Plan Current plan remains appropriate    Co-evaluation             End of Session Equipment Utilized During Treatment: Gait belt Activity Tolerance: Patient tolerated treatment well Patient left: in chair;with chair alarm set;with call bell/phone within reach     Time: 1055-1118 PT Time Calculation (min) (ACUTE ONLY): 23 min  Charges:  $Gait Training: 8-22 mins $Therapeutic Exercise: 8-22 mins                    G Codes:      Helyn Schwan, Eliseo GumKenneth V 03/08/2016, 11:31 AM  03/08/2016  [place] BingKen Minda Faas,  PT [phone]  (pager)

## 2016-03-08 NOTE — Progress Notes (Signed)
Patient having difficulty swallowing. He starts to choke and cough after sipping water and says that it sits in his throat. He has started to spit out saliva as well when having trouble swallowing. There is no outward visible swelling and no drainage on the honeycomb. Trachea is midline. Monitoring closely. Caroly Purewal, [place] ScrapeSarah E, RN

## 2016-03-08 NOTE — Progress Notes (Signed)
No issues overnight. Pt reports some increase in grip strength, esp on left. Walking well. Does report some dysphagia but still able to eat with loosened Aspen collar.  EXAM:  BP (!) 167/94 (BP Location: Right Arm)   Pulse 78   Temp 98.9 F (37.2 C) (Oral)   Resp 18   Ht 5\' 11"  (1.803 m)   Wt 72.6 kg (160 lb)   SpO2 100%   BMI 22.32 kg/m   Awake, alert, oriented  Speech fluent, appropriate  CN grossly intact  Neck soft Bilateral grip weakness, R>L Good BLE strength  IMPRESSION:  50 y.o. male POD#2 s/p C3-7 ACDF, improving from central cord syndrome. Appears to be a good rehab candidate.  PLAN: - Cont PT/OT - Hopefully can go to CIR.

## 2016-03-08 NOTE — Progress Notes (Signed)
Rehab admissions - I met with patient.  He would like inpatient rehab admission.  I have called and opened the case with [employer] of [place].  Clinicals sent.  Awaiting call back from insurance case manager.  Will follow up tomorrow after I hear back from insurance carrier.  Call me for questions.  #[phone]

## 2016-03-08 NOTE — Care Management Note (Signed)
Case Management Note  Patient Details  Name: Brian Petty MRN: [id number] Date of Birth: 08-Nov-1965  Subjective/Objective:                    Action/Plan: Pt is post C 4-7 ACDF. Recommendations are for CIR. CM following for d/c needs.   Expected Discharge Date:  03/07/16               Expected Discharge Plan:     In-House Referral:     Discharge planning Services     Post Acute Care Choice:    Choice offered to:     DME Arranged:    DME Agency:     HH Arranged:    HH Agency:     Status of Service:  In process, will continue to follow  If discussed at Long Length of Stay Meetings, dates discussed:    Additional Comments:  Kermit BaloKelli F Jaclene Bartelt, RN 03/08/2016, 3:41 PM

## 2016-03-09 ENCOUNTER — Inpatient Hospital Stay (HOSPITAL_COMMUNITY)
Admission: RE | Admit: 2016-03-09 | Discharge: 2016-03-15 | DRG: 950 | Disposition: A | Discharge status: Home / Self Care | Payer: BLUE CROSS/BLUE SHIELD | Source: Intra-hospital | Attending: Physical Medicine & Rehabilitation | Admitting: Physical Medicine & Rehabilitation

## 2016-03-09 DIAGNOSIS — K5900 Constipation, unspecified: Secondary | ICD-10-CM

## 2016-03-09 DIAGNOSIS — Z981 Arthrodesis status: Secondary | ICD-10-CM | POA: Diagnosis not present

## 2016-03-09 DIAGNOSIS — S14109A Unspecified injury at unspecified level of cervical spinal cord, initial encounter: Secondary | ICD-10-CM | POA: Diagnosis present

## 2016-03-09 DIAGNOSIS — S14126D Central cord syndrome at C6 level of cervical spinal cord, subsequent encounter: Secondary | ICD-10-CM

## 2016-03-09 DIAGNOSIS — G8918 Other acute postprocedural pain: Secondary | ICD-10-CM | POA: Diagnosis not present

## 2016-03-09 DIAGNOSIS — S14125D Central cord syndrome at C5 level of cervical spinal cord, subsequent encounter: Secondary | ICD-10-CM | POA: Diagnosis present

## 2016-03-09 DIAGNOSIS — W1789XD Other fall from one level to another, subsequent encounter: Secondary | ICD-10-CM | POA: Diagnosis not present

## 2016-03-09 DIAGNOSIS — I10 Essential (primary) hypertension: Secondary | ICD-10-CM | POA: Diagnosis not present

## 2016-03-09 DIAGNOSIS — R1310 Dysphagia, unspecified: Secondary | ICD-10-CM | POA: Diagnosis not present

## 2016-03-09 DIAGNOSIS — F17210 Nicotine dependence, cigarettes, uncomplicated: Secondary | ICD-10-CM | POA: Diagnosis not present

## 2016-03-09 DIAGNOSIS — K592 Neurogenic bowel, not elsewhere classified: Secondary | ICD-10-CM | POA: Diagnosis not present

## 2016-03-09 DIAGNOSIS — S14109S Unspecified injury at unspecified level of cervical spinal cord, sequela: Secondary | ICD-10-CM | POA: Diagnosis not present

## 2016-03-09 DIAGNOSIS — S14129A Central cord syndrome at unspecified level of cervical spinal cord, initial encounter: Secondary | ICD-10-CM | POA: Diagnosis present

## 2016-03-09 DIAGNOSIS — S14129S Central cord syndrome at unspecified level of cervical spinal cord, sequela: Secondary | ICD-10-CM

## 2016-03-09 DIAGNOSIS — S14127D Central cord syndrome at C7 level of cervical spinal cord, subsequent encounter: Secondary | ICD-10-CM | POA: Diagnosis not present

## 2016-03-09 MED ORDER — GUAIFENESIN-DM 100-10 MG/5ML PO SYRP: 5.0000 mL | ORAL_SOLUTION | Freq: Four times a day (QID) | ORAL | Status: DC | PRN

## 2016-03-09 MED ORDER — SENNA 8.6 MG PO TABS
2.0000 | ORAL_TABLET | Freq: Every day | ORAL | Status: DC
  Administered 2016-03-10 – 2016-03-14 (×5): 17.2 mg via ORAL
  Filled 2016-03-09 (×5): qty 2

## 2016-03-09 MED ORDER — TRAZODONE HCL 50 MG PO TABS
25.0000 mg | ORAL_TABLET | Freq: Every evening | ORAL | Status: DC | PRN
  Filled 2016-03-09: qty 1

## 2016-03-09 MED ORDER — BISACODYL 10 MG RE SUPP
10.0000 mg | Freq: Every day | RECTAL | Status: DC | PRN
  Administered 2016-03-11: 10 mg via RECTAL
  Filled 2016-03-09: qty 1

## 2016-03-09 MED ORDER — FLEET ENEMA 7-19 GM/118ML RE ENEM: 1.0000 | ENEMA | Freq: Once | RECTAL | Status: DC | PRN

## 2016-03-09 MED ORDER — HEPARIN SODIUM (PORCINE) 5000 UNIT/ML IJ SOLN
5000.0000 [IU] | Freq: Three times a day (TID) | INTRAMUSCULAR | Status: DC
  Administered 2016-03-09 – 2016-03-15 (×18): 5000 [IU] via SUBCUTANEOUS
  Filled 2016-03-09 (×18): qty 1

## 2016-03-09 MED ORDER — GABAPENTIN 300 MG PO CAPS: 300.0000 mg | ORAL_CAPSULE | Freq: Two times a day (BID) | ORAL | Status: DC

## 2016-03-09 MED ORDER — ALUM & MAG HYDROXIDE-SIMETH 200-200-20 MG/5ML PO SUSP: 30.0000 mL | ORAL | Status: DC | PRN

## 2016-03-09 MED ORDER — PROCHLORPERAZINE 25 MG RE SUPP: 12.5000 mg | Freq: Four times a day (QID) | RECTAL | Status: DC | PRN

## 2016-03-09 MED ORDER — PROCHLORPERAZINE EDISYLATE 5 MG/ML IJ SOLN: 5.0000 mg | Freq: Four times a day (QID) | INTRAMUSCULAR | Status: DC | PRN

## 2016-03-09 MED ORDER — METHOCARBAMOL 500 MG PO TABS
500.0000 mg | ORAL_TABLET | Freq: Four times a day (QID) | ORAL | Status: DC | PRN
  Administered 2016-03-10 – 2016-03-15 (×2): 500 mg via ORAL
  Filled 2016-03-09 (×2): qty 1

## 2016-03-09 MED ORDER — GABAPENTIN 300 MG PO CAPS
300.0000 mg | ORAL_CAPSULE | Freq: Two times a day (BID) | ORAL | Status: DC
  Administered 2016-03-09 – 2016-03-12 (×6): 300 mg via ORAL
  Filled 2016-03-09 (×6): qty 1

## 2016-03-09 MED ORDER — ONDANSETRON HCL 4 MG PO TABS
4.0000 mg | ORAL_TABLET | Freq: Four times a day (QID) | ORAL | Status: DC | PRN
Start: 2016-03-09 — End: 2016-03-15

## 2016-03-09 MED ORDER — POLYETHYLENE GLYCOL 3350 17 G PO PACK
17.0000 g | PACK | Freq: Two times a day (BID) | ORAL | Status: DC
  Administered 2016-03-09 – 2016-03-15 (×12): 17 g via ORAL
  Filled 2016-03-09 (×13): qty 1

## 2016-03-09 MED ORDER — HYDROCODONE-ACETAMINOPHEN 5-325 MG PO TABS: 1.0000 | ORAL_TABLET | ORAL | 0 refills | Status: DC | PRN

## 2016-03-09 MED ORDER — PROCHLORPERAZINE MALEATE 5 MG PO TABS: 5.0000 mg | ORAL_TABLET | Freq: Four times a day (QID) | ORAL | Status: DC | PRN

## 2016-03-09 MED ORDER — ONDANSETRON HCL 4 MG/2ML IJ SOLN: 4.0000 mg | Freq: Four times a day (QID) | INTRAMUSCULAR | Status: DC | PRN

## 2016-03-09 MED ORDER — ACETAMINOPHEN 325 MG PO TABS: 325.0000 mg | ORAL_TABLET | ORAL | Status: DC | PRN

## 2016-03-09 MED ORDER — DIPHENHYDRAMINE HCL 12.5 MG/5ML PO ELIX
12.5000 mg | ORAL_SOLUTION | Freq: Four times a day (QID) | ORAL | Status: DC | PRN
Start: 2016-03-09 — End: 2016-03-15

## 2016-03-09 MED ORDER — HYDROCODONE-ACETAMINOPHEN 5-325 MG PO TABS
1.0000 | ORAL_TABLET | ORAL | Status: DC | PRN
  Administered 2016-03-09 – 2016-03-10 (×2): 2 via ORAL
  Administered 2016-03-10: 1 via ORAL
  Administered 2016-03-11 – 2016-03-15 (×14): 2 via ORAL
  Filled 2016-03-09: qty 1
  Filled 2016-03-09 (×16): qty 2

## 2016-03-09 NOTE — Discharge Summary (Signed)
  Physician Discharge Summary  Patient ID: Brian Petty MRN: [id number] DOB/AGE: 1966/05/05 50 y.o.  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 Brian Petty is a 50 y.o. male who suffered a fall while intoxicated. He was found to have a unilateral C6-7 fracture, and a cord contusion with underlying mod-severe stenosis. He underwent ACDF without complication from C3-C7. He did have improvement in ambulation, and mild improvement in his grip strength. He was seen by PT/OT and PMR and was a good candidate for CIR. He was therefore discharged in stable condition, walking well, tolerating diet, voiding normally.  Treatments: Surgery - ACDF C3-C7  Discharge Exam: Blood pressure (!) 145/85, pulse 85, temperature 98.1 F (36.7 C), temperature source Oral, resp. rate 18, height 5\' 11"  (1.803 m), weight 72.6 kg (160 lb), SpO2 100 %. Awake, alert, oriented Speech fluent, appropriate CN grossly intact 5/5 BUE/BLE x 2/5 right grip, 3/5 left grip Wound c/d/i  Disposition: CIR     Medication List    TAKE these medications   gabapentin 300 MG capsule Commonly known as:  NEURONTIN Take 1 capsule (300 mg total) by mouth 2 (two) times daily.   HYDROcodone-acetaminophen 5-325 MG tablet Commonly known as:  NORCO/VICODIN Take 1-2 tablets by mouth every 4 (four) hours as needed for moderate pain.   ibuprofen 200 MG tablet Commonly known as:  ADVIL,MOTRIN Take 600 mg by mouth every 6 (six) hours as  needed (pain).   OVER THE COUNTER MEDICATION Place 1 drop into both eyes 3 (three) times daily as needed (dry eyes). Over the counter lubricating eye drop      Follow-up Information    Neosha Switalski, C, MD. Schedule an appointment as soon as possible for a visit in 3 week(s).   Specialty:  Neurosurgery Why:  for close follow up of your neck fracture Contact information: [address] [place] [address] [phone]           Signed: Lisbeth RenshawUNDKUMAR, Beren Yniguez, Salena SanerC 03/09/2016, 12:19 PM

## 2016-03-09 NOTE — Progress Notes (Signed)
Retta Diones, RN Rehab Admission Coordinator Signed Physical Medicine and Rehabilitation  PMR Pre-admission Date of Service: 03/09/2016 12:26 PM  Related encounter: ED to Hosp-Admission (Discharged) from 03/04/2016 in [place]       [] Hide copied text PMR Admission Coordinator Pre-Admission Assessment  Patient: Brian Petty is an 50 y.o., male MRN: [id number] DOB: 09/15/1965 Height: 5' 11"  (180.3 cm) Weight: 72.6 kg (160 lb)                                                                                                                                                                                                                                                                          Insurance Information HMO:      PPO: Yes     PCP:       IPA:       80/20:       OTHER:   PRIMARY: [employer] [place]      Policy#: [id number]      Subscriber: Brian Petty CM Name: Brian Petty      Phone#: [phone]     Fax#: [phone] Pre-Cert#: [id number]      Employer: [employer] manager Benefits:  Phone #: [phone]     Name: Teodoro Kil. Date: 07/23/14     Deduct: $3000 (met $0)      Out of Pocket Max: $5950      Life Max: Unlimited CIR: 80% after Ded met      SNF: 80% after Ded met Outpatient: 80% after Ded met     Co-Pay: 20% Home Health: 80% after Ded met      Co-Pay: 20% DME: 80% after Ded met     Co-Pay: 20% Providers: in network  Medicaid Application Date:        Case Manager:   Disability Application Date:        Case Worker:    Emergency [place]    Name Relation Home Work Mobile   Flores,Missy Friend   [phone]     Current Medical History  Patient Admitting Diagnosis: Central cord syndrome    History of Present Illness: A 50 y.o.malewho was admitted  on 03/04/16 after fall off bar stool and was unresponsive at scene. He was responsive to stimuli on evaluation by EMS and had reports of sharp burning  pin BUE, bilateral hand weakness and unsteady gait. MRI spine with edema C6 with suspicion for fracture, cord contusion C5/6 with question of ligamentous injury and multilevel spondylosis most severe at C6-7. He was evaluated by Dr. 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 He underwent ACDF C4-&on 03/06/16  He reports that his hand are feeling a little better. Lives alone but has friends who has handicapped accessible home and can provide supervision.    Past Medical History  History reviewed. No pertinent past medical history.  Family History  family history is not on file.  Prior Rehab/Hospitalizations: No previous rehab admissions  Has the patient had major surgery during 100 days prior to admission? No  Current Medications   Current Facility-Administered Medications:  .  0.9 %  sodium chloride infusion, , Intravenous, Continuous, Consuella Lose, MD, Last Rate: 75 mL/hr at 03/09/16 0800 .  diazepam (VALIUM) tablet 5 mg, 5 mg, Oral, Q6H PRN, Kristeen Miss, MD, 5 mg at 03/08/16 1610 .  docusate sodium (COLACE) capsule 100 mg, 100 mg, Oral, BID, Consuella Lose, MD, 100 mg at 03/09/16 0900 .  gabapentin (NEURONTIN) capsule 300 mg, 300 mg, Oral, BID, Consuella Lose, MD, 300 mg at 03/09/16 0901 .  heparin injection 5,000 Units, 5,000 Units, Subcutaneous, Q8H, Consuella Lose, MD, 5,000 Units at 03/09/16 0533 .  HYDROcodone-acetaminophen (NORCO/VICODIN) 5-325 MG per tablet 1-2 tablet, 1-2 tablet, Oral, Q4H PRN, Consuella Lose, MD, 2 tablet at 03/09/16 0407 .  ondansetron (ZOFRAN) tablet 4 mg, 4 mg, Oral, Q6H PRN **OR** ondansetron (ZOFRAN) injection 4 mg, 4 mg, Intravenous, Q6H PRN, Consuella Lose, MD .  senna (SENOKOT) tablet 8.6 mg, 1 tablet, Oral, BID, Consuella Lose, MD, 8.6 mg at 03/09/16 0902  Patients Current Diet: Diet full liquid Room service appropriate? Yes;  Fluid consistency: Thin  Precautions / Restrictions Precautions Precautions: Cervical, Fall Precaution Comments: handout provided Cervical Brace: Hard collar Restrictions Weight Bearing Restrictions: No   Has the patient had 2 or more falls or a fall with injury in the past year?No.  He has had one fall from bar stool which resulted in current hospital admission and current injuries.  Prior Activity Level Community (5-7x/wk): Went out daily.  worked FT as a Estate manager/land agent, was driving.  Home Assistive Devices / Equipment Home Assistive Devices/Equipment: None Home Equipment: None  Prior Device Use: Indicate devices/aids used by the patient prior to current illness, exacerbation or injury? None  Prior Functional Level Prior Function Level of Independence: Independent Comments: works as Chartered certified accountant; drives  Self Care: Did the patient need help bathing, dressing, using the toilet or eating?  Independent  Indoor Mobility: Did the patient need assistance with walking from room to room (with or without device)? Independent  Stairs: Did the patient need assistance with internal or external stairs (with or without device)? Independent  Functional Cognition: Did the patient need help planning regular tasks such as shopping or remembering to take medications? Independent  Current Functional Level Cognition Overall Cognitive Status: Within Functional Limits for tasks assessed Orientation Level: Oriented X4    Extremity Assessment (includes Sensation/Coordination) Upper Extremity Assessment: Defer to OT evaluation RUE Deficits / Details: 3+/5 shoulder flexion/ER/Abd/elbow flex. 1/5 elbow extension. 3/5 wrist ext/supination/pronation. minimal movement of R thumb and trace finger flexion. no active  finger extion noted. Attmepting to use R hand as functional gross assist. Hypersensitive to touch RUE Sensation: decreased light touch (hypersensitivity. numbness in  fingertips) RUE Coordination: decreased fine motor, decreased gross motor LUE Deficits / Details: stronger than R. shoulder, elbow 3+/5. wrist extension/supination/pronation 3/5. increased movement of L thumb and able to oppose index and middle fingers. 2/5 finger extension. Using L hand as dominant hand. Able to complete full composite flexion (3/5).Extensor lag of digits @ 30 degress. Minimal movement of intrinisics. Less complaints of hypersensitivity in LUE. numbness in fingertips. Able to hold tubing for toothbrush but has difficulty with strength LUE Sensation: decreased light touch (hypersensitivity throughout arm/numbness in fingertips) LUE Coordination: decreased fine motor, decreased gross motor  Lower Extremity Assessment: Generalized weakness   ADLs Overall ADL's : Needs assistance/impaired Eating/Feeding: Minimal assistance, Sitting Eating/Feeding Details (indicate cue type and reason): difficulty managing utensils, keeping food on plate, opening packages Grooming: Moderate assistance, Sitting Upper Body Bathing: Minimal assitance, Sitting Lower Body Bathing: Minimal assistance, Sit to/from stand Upper Body Dressing : Moderate assistance, Sitting Lower Body Dressing: Moderate assistance, Sit to/from stand Lower Body Dressing Details (indicate cue type and reason): difficulty with grasping waistband with R hand Toilet Transfer: Minimal assistance, Ambulation, Comfort height toilet Toileting- Clothing Manipulation and Hygiene: Minimal assistance Functional mobility during ADLs: Minimal assistance (unsteady with ambulation/scissors at times) General ADL Comments: Issued pt tubing to build up utensils and toothbrush. Pt asking about return of UE function.    Mobility Overal bed mobility: Needs Assistance Bed Mobility: Rolling, Sidelying to Sit Rolling: Min assist Sidelying to sit: Min assist General bed mobility comments: up in the chair   Transfers Overall transfer level: Needs  assistance Equipment used: 2 person hand held assist Transfers: Sit to/from Stand Sit to Stand: Min assist Stand pivot transfers: Min assist, +2 physical assistance General transfer comment: Initial unsteadiness easy to manage with some control at hips.   Ambulation / Gait / Stairs / Wheelchair Mobility Ambulation/Gait Ambulation/Gait assistance: Museum/gallery curator (Feet): 200 Feet Assistive device: 1 person hand held assist (iv pole) Gait Pattern/deviations: Step-through pattern General Gait Details: Unsteady gait with narrow BOS.  Worked on heel/toe with had the result of helping widen BOS some of the time.  Still scissored and buckled softly at times. Gait velocity: decreased Gait velocity interpretation: Below normal speed for age/gender   Posture / Balance Balance Overall balance assessment: Needs assistance Sitting-balance support: No upper extremity supported Sitting balance-Leahy Scale: Fair Standing balance support: Bilateral upper extremity supported Standing balance-Leahy Scale: Poor Standing balance comment: still relying on some assistive device for stability   Special needs/care consideration BiPAP/CPAP No CPM No Continuous Drip IV 0.9% NS 75 mL/hr Dialysis No       Life Vest No Oxygen No Special Bed No Trach Size No Wound Vac (area) No      Skin:  Anterior cervical neck incision with C-collar in place                              Bowel mgmt: Last BM patient reports was 03/04/16.  No BM since admission Bladder mgmt: Voiding in urinal Diabetic mgmt No   Previous Home Environment Living Arrangements: Alone Available Help at Discharge: Friend(s), Available PRN/intermittently Type of Home: Apartment Home Layout: One level Home Access: Stairs to enter Entrance Stairs-Rails: Right, Left, Can reach both Entrance Stairs-Number of Steps: 2 flights  Bathroom Shower/Tub: Tub/shower unit, Theatre stage manager Accessibility:  Yes How Accessible: Accessible via  walker Home Care Services: No  Discharge Living Setting Plans for Discharge Living Setting: Alone, Apartment Type of Home at Discharge: Apartment Discharge Home Layout: One level Discharge Home Access: Stairs to enter Entrance Stairs-Number of Steps: About 15 steps to 2nd level apartment Does the patient have any problems obtaining your medications?: No  Social/Family/Support Systems Contact Information: Sabino Snipes - friend - [phone] Anticipated Caregiver: self and possible friends Ability/Limitations of Caregiver: Friend Missy has offered her home as needed Caregiver Availability: Intermittent Discharge Plan Discussed with Primary Caregiver: Yes Is Caregiver In Agreement with Plan?: Yes Does Caregiver/Family have Issues with Lodging/Transportation while Pt is in Rehab?: No  Goals/Additional Needs Patient/Family Goal for Rehab: PT mod I and supervision, OT supervision and min assist, SLP independent and supervision goals Expected length of stay: 14-18 days Cultural Considerations: None Dietary Needs: Full liquids, thin liquids Equipment Needs: TBD Additional Information: Could go home with friend Missy and her husband to handicapp accessible 1 level home with 1 step entry. Pt/Family Agrees to Admission and willing to participate: Yes Program Orientation Provided & Reviewed with Pt/Caregiver Including Roles  & Responsibilities: Yes  Decrease burden of Care through IP rehab admission: N/A  Possible need for SNF placement upon discharge: Yes, if patient cannot progress to home alone or home with friends with intermittent supervision.  Insurance carrier aware of potential need for SNF post rehab stay.  Patient Condition: This patient's medical and functional status has changed since the consult dated: 03/07/16 in which the Rehabilitation Physician determined and documented that the patient's condition is appropriate for intensive rehabilitative care in an inpatient  rehabilitation facility. See "History of Present Illness" (above) for medical update. Functional changes are: Currently requiring min assist to ambulate 200 feet + 1 HHA.  Requiring min to mod assist with ADL needs. Patient's medical and functional status update has been discussed with the Rehabilitation physician and patient remains appropriate for inpatient rehabilitation. Will admit to inpatient rehab today.  Preadmission Screen Completed By:  Retta Diones, 03/09/2016 12:43 PM ______________________________________________________________________   Discussed status with Dr. Naaman Plummer on 03/09/16 at 1242 and received telephone approval for admission today.  Admission Coordinator:  Retta Diones, time1242/Date08/18/17       Cosigned by: Meredith Staggers, MD at 03/09/2016 1:11 PM  Revision History

## 2016-03-09 NOTE — Plan of Care (Signed)
Problem: SCI BOWEL ELIMINATION Goal: RH STG MANAGE BOWEL WITH ASSISTANCE STG Manage Bowel with mod.I.  Outcome: Not Progressing Last bm 8/12. miralax given.

## 2016-03-09 NOTE — Interval H&P Note (Signed)
Brian Petty was admitted today to Inpatient Rehabilitation with the diagnosis of cervical central cord syndrome.  The patient's history has been reviewed, patient examined, and there is no change in status.  Patient continues to be appropriate for intensive inpatient rehabilitation.  I have reviewed the patient's chart and labs.  Questions were answered to the patient's satisfaction. The PAPE has been reviewed and assessment remains appropriate.  SWARTZ,ZACHARY T 03/09/2016, 5:20 PM

## 2016-03-09 NOTE — Care Management Note (Signed)
Case Management Note  Patient Details  Name: Ivin BootyMichael Salvato MRN: [id number] Date of Birth: 01/21/1966  Subjective/Objective:                    Action/Plan: Pt is post C 4-7 ACDF. Recommendations are for CIR. CM following for d/c needs.   Expected Discharge Date:  03/07/16               Expected Discharge Plan:  IP Rehab Facility  In-House Referral:     Discharge planning Services  CM Consult  Post Acute Care Choice:    Choice offered to:     DME Arranged:    DME Agency:     HH Arranged:    HH Agency:     Status of Service:  Completed, signed off  If discussed at [employer] Meetings, dates discussed:    Additional Comments: 03/09/2016 Pt will discharge to CIR today Cherylann ParrClaxton, Sinda Leedom S, RN 03/09/2016, 1:43 PM

## 2016-03-09 NOTE — Progress Notes (Signed)
Rehab admissions - I have approval for acute inpatient rehab admission for today.  I do have rehab beds available.  Will follow up this am with patient and will plan inpatient rehab admission for today.  Call me for questions.  #[phone]

## 2016-03-09 NOTE — H&P (Signed)
Physical Medicine and Rehabilitation Admission H&P    Chief Complaint  Patient presents with  . Central cord syndrome.    HPI: Brian Petty is a 50 y.o. male who was admitted on 03/04/16 after fall off bar stool and was unresponsive at scene. He was responsive to stimuli on evaluation by EMS and had reports of sharp burning pain BUE, bilateral hand weakness and unsteady gait.  ETOH level 119.  MRI spine with edema C6 with suspicion for fracture, cord contusion C5/6 with question of ligamentous injury and multilevel spondylosis most severe at C6-7. He was evaluated by Dr. 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  He  underwent decompression and  ACDF C4-7 on 03/06/16 and post op has had improvement in BUE strength and decrease in neuropathy.  He did report problems with swallowing and difficulty with liquids. Patient with difficulty with ADL tasks and balance deficits. CIR recommended for follow up therapy.     Review of Systems  Constitutional: Negative for malaise/fatigue.  HENT: Negative for hearing loss.   Eyes: Negative for blurred vision and double vision.  Respiratory: Negative for cough and shortness of breath.   Cardiovascular: Negative for chest pain, palpitations and leg swelling.  Gastrointestinal: Positive for constipation. Negative for heartburn and nausea.  Genitourinary: Negative for dysuria and urgency.  Musculoskeletal: Positive for myalgias and neck pain.  Skin: Negative for itching and rash.  Neurological: Positive for sensory change, focal weakness and weakness. Negative for dizziness, tingling and headaches.  Psychiatric/Behavioral: Negative for depression and memory loss. The patient does not have insomnia.       History reviewed. No pertinent past medical history.    Past Surgical History:  Procedure Laterality Date  . ANTERIOR CERVICAL DECOMP/DISCECTOMY FUSION N/A  03/06/2016   Procedure: ANTERIOR CERVICAL DECOMPRESSION/DISCECTOMY FUSION CERVICAL FOUR- CERVICAL FIVE, CERVICAL FIVE- CERVICAL SIX, CERVICAL SIX- CERVICAL SEVEN;  Surgeon: Lisbeth Renshaw, MD;  Location: [facility];  Service: Neurosurgery;  Laterality: N/A;  ANTERIOR CERVICAL DECOMPRESSION/DISCECTOMY FUSION C4-C5, C5-C6, C6-C7    History reviewed. No pertinent family history.    Social History:  reports that he has been smoking Cigarettes.  He has been smoking about 0.50 packs per day. He has never used smokeless tobacco. He reports that he drinks alcohol. He reports that he does not use drugs.    Allergies  Allergen Reactions  . No Known Allergies     Medications Prior to Admission  Medication Sig Dispense Refill  . ibuprofen (ADVIL,MOTRIN) 200 MG tablet Take 600 mg by mouth every 6 (six) hours as needed (pain).    Marland Kitchen OVER THE COUNTER MEDICATION Place 1 drop into both eyes 3 (three) times daily as needed (dry eyes). Over the counter lubricating eye drop      Home: Home Living Family/patient expects to be discharged to:: Private residence Living Arrangements: Alone Available Help at Discharge: Friend(s), Available PRN/intermittently Type of Home: Apartment Home Access: Stairs to enter [employer] of Steps: 2 flights  Entrance Stairs-Rails: Right, Left, Can reach both Home Layout: One level Bathroom Shower/Tub: Tub/shower unit, Corporate treasurer Accessibility: Yes Home Equipment: None   Functional History: Prior Function Level of Independence: Independent Comments: works as Manufacturing systems engineer; drives  Functional Status:  Mobility: Bed Mobility Overal bed mobility: Needs Assistance Bed Mobility: Rolling, Sidelying to Sit Rolling: Min assist Sidelying to sit: Min assist General bed mobility comments: up in the chair Transfers Overall transfer level: Needs  assistance Equipment used: 2 person hand held assist Transfers: Sit to/from Stand Sit to Stand: Min  assist Stand pivot transfers: Min assist, +2 physical assistance General transfer comment: Initial unsteadiness easy to manage with some control at hips. Ambulation/Gait Ambulation/Gait assistance: Min assist Ambulation Distance (Feet): 200 Feet Assistive device: 1 person hand held assist (iv pole) Gait Pattern/deviations: Step-through pattern General Gait Details: Unsteady gait with narrow BOS.  Worked on heel/toe with had the result of helping widen BOS some of the time.  Still scissored and buckled softly at times. Gait velocity: decreased Gait velocity interpretation: Below normal speed for age/gender    ADL: ADL Overall ADL's : Needs assistance/impaired Eating/Feeding: Minimal assistance, Sitting Eating/Feeding Details (indicate cue type and reason): difficulty managing utensils, keeping food on plate, opening packages Grooming: Moderate assistance, Sitting Upper Body Bathing: Minimal assitance, Sitting Lower Body Bathing: Minimal assistance, Sit to/from stand Upper Body Dressing : Moderate assistance, Sitting Lower Body Dressing: Moderate assistance, Sit to/from stand Lower Body Dressing Details (indicate cue type and reason): difficulty with grasping waistband with R hand Toilet Transfer: Minimal assistance, Ambulation, Comfort height toilet Toileting- Clothing Manipulation and Hygiene: Minimal assistance Functional mobility during ADLs: Minimal assistance (unsteady with ambulation/scissors at times) General ADL Comments: Issued pt tubing to build up utensils and toothbrush. Pt asking about return of UE function.   Cognition: Cognition Overall Cognitive Status: Within Functional Limits for tasks assessed Orientation Level: Oriented X4 Cognition Arousal/Alertness: Awake/alert Behavior During Therapy: WFL for tasks assessed/performed Overall Cognitive Status: Within Functional Limits for tasks assessed   Blood pressure (!) 145/85, pulse 85, temperature 98.1 F (36.7 C),  temperature source Oral, resp. rate 18, height 5\' 11"  (1.803 m), weight 72.6 kg (160 lb), SpO2 100 %. Physical Exam  Nursing note and vitals reviewed. Constitutional: He is oriented to person, place, and time. He appears well-developed and well-nourished. Cervical collar in place.  HENT:  Head: Normocephalic and atraumatic.  Mouth/Throat: Oropharynx is clear and moist.  Eyes: Conjunctivae and EOM are normal. Pupils are equal, round, and reactive to light.  Neck:  Honey comb dressing anterior neck--no drainage. Collar in place.   Cardiovascular: Normal rate and regular rhythm.   No murmur heard. Respiratory: Effort normal and breath sounds normal. No stridor. No respiratory distress. He has no wheezes.  GI: Soft. Bowel sounds are normal. He exhibits no distension. There is no tenderness.  Musculoskeletal: He exhibits edema (min edema bilateral hands).  Neurological: He is alert and oriented to person, place, and time. No cranial nerve deficit.  Soft voice --no evidence of dysphonia. Follows commands without difficulty.   Motor: B/L UE: Should abduction, elbow flexion  wrist  4 to 5/5, Right: tricep 2, wrist 2, HI trace Left: tricep 3-, wrist 4, HI 3- B/l LE: 5/5 proximal to distal DTRs symmetric Sensation decreased to light touch in both hands and distal arms. No allodynia or dysesthesias. LE sensation normal. DTR's 1+  Skin: Skin is warm and dry.  Psychiatric: He has a normal mood and affect. His behavior is normal. Judgment and thought content normal.    No results found for this or any previous visit (from the past 48 hour(s)). No results found.     Medical Problem List and Plan: 1.  Upper extremity weakness and functional/mobility deficits secondary to central cord syndrome (pt st/p decompression and ACDF C4-7) 2.  DVT Prophylaxis/Anticoagulation: Mechanical: Sequential compression devices, below knee Bilateral lower extremities. Increase mobility 3. Pain Management: Reports  ongoing dysesthesias   -titrate  gabapentin up to 300mg  TID 4. Mood: LCSW to follow for evaluation and support.  5. Neuropsych: This patient is capable of making decisions on his own behalf. 6. Skin/Wound Care: Monitor wound daily for healing/signs of infection.  7. Fluids/Electrolytes/Nutrition: Monitor I/O. Check lytes in am.   8.  Dysphagia: Will modify diet to dysphagia 2, nectar and have ST evaluate swallow for appropriate diet.  9. Constipation: Augment bowel program--will add miralax bid as Senna S ineffective.      Post Admission Physician Evaluation: 1. Functional deficits secondary  to central cord syndrome. 2. Patient is admitted to receive collaborative, interdisciplinary care between the physiatrist, rehab nursing staff, and therapy team. 3. Patient's level of medical complexity and substantial therapy needs in context of that medical necessity cannot be provided at a lesser intensity of care such as a SNF. 4. Patient has experienced substantial functional loss from his/her baseline which was documented above under the "Functional History" and "Functional Status" headings.  Judging by the patient's diagnosis, physical exam, and functional history, the patient has potential for functional progress which will result in measurable gains while on inpatient rehab.  These gains will be of substantial and practical use upon discharge  in facilitating mobility and self-care at the household level. 5. Physiatrist will provide 24 hour management of medical needs as well as oversight of the therapy plan/treatment and provide guidance as appropriate regarding the interaction of the two. 6. 24 hour rehab nursing will assist with bladder management, bowel management, safety, skin/wound care, disease management, medication administration, pain management and patient education  and help integrate therapy concepts, techniques,education, etc. 7. PT will assess and treat for/with: Lower extremity  strength, range of motion, stamina, balance, functional mobility, safety, adaptive techniques and equipment, NMR, pain mgt, education, community reintegration.   Goals are: mod I. 8. OT will assess and treat for/with: ADL's, functional mobility, safety, upper extremity strength, adaptive techniques and equipment, NMR, pain mgt, ego support, education.   Goals are: mod I to set up. Therapy may proceed with showering this patient. 9. SLP will assess and treat for/with: will hold on SLP but observe for any issues with swallowing---pt reports improvement. 10. Case Management and Social Worker will assess and treat for psychological issues and discharge planning. 11. Team conference will be held weekly to assess progress toward goals and to determine barriers to discharge. 12. Patient will receive at least 3 hours of therapy per day at least 5 days per week. 13. ELOS: 7-9 days       14. Prognosis:  excellent     Ranelle Oyster, MD, [facility] Physical Medicine & Rehabilitation 03/09/2016  03/09/2016

## 2016-03-09 NOTE — PMR Pre-admission (Signed)
PMR Admission Coordinator Pre-Admission Assessment  Patient: Brian Petty is an 50 y.o., male MRN: [id number] DOB: Feb 25, 1966 Height: _0  (180.3 cm) Weight: 72.6 kg (160 lb)              Insurance Information HMO:      PPO: Yes     PCP:       IPA:       80/20:       OTHER:   PRIMARY: [employer] [place]      Policy#: [id number]      Subscriber: Brian Petty CM Name: Brian Petty      Phone#: [phone]     Fax#: [phone] Pre-Cert#: [id number]      Employer: [employer] manager Benefits:  Phone #: [phone]     Name: Brian Petty. Date: 07/23/14     Deduct: $3000 (met $0)      Out of Pocket Max: $5950      Life Max: Unlimited CIR: 80% after Ded met      SNF: 80% after Ded met Outpatient: 80% after Ded met     Co-Pay: 20% Home Health: 80% after Ded met      Co-Pay: 20% DME: 80% after Ded met     Co-Pay: 20% Providers: in network  Medicaid Application Date:        Case Manager:   Disability Application Date:        Case Worker:    Emergency [place]    Name Relation Home Work Mobile   Brian Petty Friend   [phone]     Current Medical History  Patient Admitting Diagnosis: Central cord syndrome    History of Present Illness:  A 50 y.o. male who was admitted on 03/04/16 after fall off bar stool and was unresponsive at scene. He was responsive to stimuli on evaluation by EMS and had reports of sharp burning pin BUE, bilateral hand weakness and unsteady gait.  MRI spine with edema C6 with suspicion for fracture, cord contusion C5/6 with question of ligamentous injury and multilevel spondylosis most severe at C6-7. He was evaluated by Dr. 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  He  underwent ACDF C4-& on 03/06/16  He reports that his hand are feeling a little better. Lives alone but has friends who has handicapped accessible home and can provide supervision.    Past  Medical History  History reviewed. No pertinent past medical history.  Family History  family history is not on file.  Prior Rehab/Hospitalizations: No previous rehab admissions  Has the patient had major surgery during 100 days prior to admission? No  Current Medications   Current Facility-Administered Medications:  .  0.9 %  sodium chloride infusion, , Intravenous, Continuous, Brian Lose, MD, Last Rate: 75 mL/hr at 03/09/16 0800 .  diazepam (VALIUM) tablet 5 mg, 5 mg, Oral, Q6H PRN, Brian Miss, MD, 5 mg at 03/08/16 1610 .  docusate sodium (COLACE) capsule 100 mg, 100 mg, Oral, BID, Brian Lose, MD, 100 mg at 03/09/16 0900 .  gabapentin (NEURONTIN) capsule 300 mg, 300 mg, Oral, BID, Brian Lose, MD, 300 mg at 03/09/16 0901 .  heparin injection 5,000 Units, 5,000 Units, Subcutaneous, Q8H, Brian Lose, MD, 5,000 Units at 03/09/16 0533 .  HYDROcodone-acetaminophen (NORCO/VICODIN) 5-325 MG per tablet 1-2 tablet, 1-2 tablet, Oral, Q4H PRN, Brian Lose, MD, 2 tablet at 03/09/16 0407 .  ondansetron (ZOFRAN) tablet 4 mg, 4 mg, Oral, Q6H PRN **  OR** ondansetron (ZOFRAN) injection 4 mg, 4 mg, Intravenous, Q6H PRN, Brian Lose, MD .  senna (SENOKOT) tablet 8.6 mg, 1 tablet, Oral, BID, Brian Lose, MD, 8.6 mg at 03/09/16 0902  Patients Current Diet: Diet full liquid Room service appropriate? Yes; Fluid consistency: Thin  Precautions / Restrictions Precautions Precautions: Cervical, Fall Precaution Comments: handout provided Cervical Brace: Hard collar Restrictions Weight Bearing Restrictions: No   Has the patient had 2 or more falls or a fall with injury in the past year?No.  He has had one fall from bar stool which resulted in current hospital admission and current injuries.  Prior Activity Level Community (5-7x/wk): Went out daily.  worked FT as a Estate manager/land agent, was driving.  Home Assistive Devices / Equipment Home Assistive  Devices/Equipment: None Home Equipment: None  Prior Device Use: Indicate devices/aids used by the patient prior to current illness, exacerbation or injury? None  Prior Functional Level Prior Function Level of Independence: Independent Comments: works as Chartered certified accountant; drives  Self Care: Did the patient need help bathing, dressing, using the toilet or eating?  Independent  Indoor Mobility: Did the patient need assistance with walking from room to room (with or without device)? Independent  Stairs: Did the patient need assistance with internal or external stairs (with or without device)? Independent  Functional Cognition: Did the patient need help planning regular tasks such as shopping or remembering to take medications? Independent  Current Functional Level Cognition  Overall Cognitive Status: Within Functional Limits for tasks assessed Orientation Level: Oriented X4    Extremity Assessment (includes Sensation/Coordination)  Upper Extremity Assessment: Defer to OT evaluation RUE Deficits / Details: 3+/5 shoulder flexion/ER/Abd/elbow flex. 1/5 elbow extension. 3/5 wrist ext/supination/pronation. minimal movement of R thumb and trace finger flexion. no active finger extion noted. Attmepting to use R hand as functional gross assist. Hypersensitive to touch RUE Sensation: decreased light touch (hypersensitivity. numbness in fingertips) RUE Coordination: decreased fine motor, decreased gross motor LUE Deficits / Details: stronger than R. shoulder, elbow 3+/5. wrist extension/supination/pronation 3/5. increased movement of L thumb and able to oppose index and middle fingers. 2/5 finger extension. Using L hand as dominant hand. Able to complete full composite flexion (3/5).Extensor lag of digits @ 30 degress. Minimal movement of intrinisics. Less complaints of hypersensitivity in LUE. numbness in fingertips. Able to hold tubing for toothbrush but has difficulty with strength LUE Sensation:  decreased light touch (hypersensitivity throughout arm/numbness in fingertips) LUE Coordination: decreased fine motor, decreased gross motor  Lower Extremity Assessment: Generalized weakness    ADLs  Overall ADL's : Needs assistance/impaired Eating/Feeding: Minimal assistance, Sitting Eating/Feeding Details (indicate cue type and reason): difficulty managing utensils, keeping food on plate, opening packages Grooming: Moderate assistance, Sitting Upper Body Bathing: Minimal assitance, Sitting Lower Body Bathing: Minimal assistance, Sit to/from stand Upper Body Dressing : Moderate assistance, Sitting Lower Body Dressing: Moderate assistance, Sit to/from stand Lower Body Dressing Details (indicate cue type and reason): difficulty with grasping waistband with R hand Toilet Transfer: Minimal assistance, Ambulation, Comfort height toilet Toileting- Clothing Manipulation and Hygiene: Minimal assistance Functional mobility during ADLs: Minimal assistance (unsteady with ambulation/scissors at times) General ADL Comments: Issued pt tubing to build up utensils and toothbrush. Pt asking about return of UE function.     Mobility  Overal bed mobility: Needs Assistance Bed Mobility: Rolling, Sidelying to Sit Rolling: Min assist Sidelying to sit: Min assist General bed mobility comments: up in the chair    Transfers  Overall transfer level: Needs assistance Equipment  used: 2 person hand held assist Transfers: Sit to/from Stand Sit to Stand: Min assist Stand pivot transfers: Min assist, +2 physical assistance General transfer comment: Initial unsteadiness easy to manage with some control at hips.    Ambulation / Gait / Stairs / Wheelchair Mobility  Ambulation/Gait Ambulation/Gait assistance: Museum/gallery curator (Feet): 200 Feet Assistive device: 1 person hand held assist (iv pole) Gait Pattern/deviations: Step-through pattern General Gait Details: Unsteady gait with narrow BOS.   Worked on heel/toe with had the result of helping widen BOS some of the time.  Still scissored and buckled softly at times. Gait velocity: decreased Gait velocity interpretation: Below normal speed for age/gender    Posture / Balance Balance Overall balance assessment: Needs assistance Sitting-balance support: No upper extremity supported Sitting balance-Leahy Scale: Fair Standing balance support: Bilateral upper extremity supported Standing balance-Leahy Scale: Poor Standing balance comment: still relying on some assistive device for stability    Special needs/care consideration BiPAP/CPAP No CPM No Continuous Drip IV 0.9% NS 75 mL/hr Dialysis No       Life Vest No Oxygen No Special Bed No Trach Size No Wound Vac (area) No      Skin:  Anterior cervical neck incision with C-collar in place                              Bowel mgmt: Last BM patient reports was 03/04/16.  No BM since admission Bladder mgmt: Voiding in urinal Diabetic mgmt No   Previous Home Environment Living Arrangements: Alone Available Help at Discharge: Friend(s), Available PRN/intermittently Type of Home: Apartment Home Layout: One level Home Access: Stairs to enter Entrance Stairs-Rails: Right, Left, Can reach both Entrance Stairs-Number of Steps: 2 flights  Bathroom Shower/Tub: Tub/shower unit, Theatre stage manager Accessibility: Yes How Accessible: Accessible via walker Home Care Services: No  Discharge Living Setting Plans for Discharge Living Setting: Alone, Apartment Type of Home at Discharge: Apartment Discharge Home Layout: One level Discharge Home Access: Stairs to enter Entrance Stairs-Number of Steps: About 15 steps to 2nd level apartment Does the patient have any problems obtaining your medications?: No  Social/Family/Support Systems Contact Information: Sabino Snipes - friend - [phone] Anticipated Caregiver: self and possible friends Ability/Limitations of Caregiver: Friend Missy has  offered her home as needed Caregiver Availability: Intermittent Discharge Plan Discussed with Primary Caregiver: Yes Is Caregiver In Agreement with Plan?: Yes Does Caregiver/Family have Issues with Lodging/Transportation while Pt is in Rehab?: No  Goals/Additional Needs Patient/Family Goal for Rehab: PT mod I and supervision, OT supervision and min assist, SLP independent and supervision goals Expected length of stay: 14-18 days Cultural Considerations: None Dietary Needs: Full liquids, thin liquids Equipment Needs: TBD Additional Information: Could go home with friend Missy and her husband to handicapp accessible 1 level home with 1 step entry. Pt/Family Agrees to Admission and willing to participate: Yes Program Orientation Provided & Reviewed with Pt/Caregiver Including Roles  & Responsibilities: Yes  Decrease burden of Care through IP rehab admission: N/A  Possible need for SNF placement upon discharge: Yes, if patient cannot progress to home alone or home with friends with intermittent supervision.  Insurance carrier aware of potential need for SNF post rehab stay.  Patient Condition: This patient's medical and functional status has changed since the consult dated: 03/07/16 in which the Rehabilitation Physician determined and documented that the patient's condition is appropriate for intensive rehabilitative care in an inpatient rehabilitation facility. See "History of  Present Illness" (above) for medical update. Functional changes are: Currently requiring min assist to ambulate 200 feet + 1 HHA.  Requiring min to mod assist with ADL needs. Patient's medical and functional status update has been discussed with the Rehabilitation physician and patient remains appropriate for inpatient rehabilitation. Will admit to inpatient rehab today.  Preadmission Screen Completed By:  Retta Diones, 03/09/2016 12:43 PM ______________________________________________________________________    Discussed status with Dr. Naaman Plummer on 03/09/16 at 1242 and received telephone approval for admission today.  Admission Coordinator:  Retta Diones, time1242/Date08/18/17

## 2016-03-09 NOTE — Progress Notes (Signed)
No issues overnight. Pt reports continued improvement in swallowing. Walking well. Has had some subjective improvement in UE strength, L>R  EXAM:  BP (!) 145/85 (BP Location: Right Arm)   Pulse 85   Temp 98.1 F (36.7 C) (Oral)   Resp 18   Ht 5\' 11"  (1.803 m)   Wt 72.6 kg (160 lb)   SpO2 100%   BMI 22.32 kg/m   Awake, alert, oriented  Speech fluent, appropriate  CN grossly intact  Distal BUE weakness stable, R>L  IMPRESSION:  50 y.o. male POD# 3 s/p C3-7 ACDF, doing well  PLAN: - Plan on transfer to CIR today

## 2016-03-09 NOTE — Progress Notes (Signed)
Ankit Karis Juba, MD Physician Signed Physical Medicine and Rehabilitation  Consult Note Date of Service: 03/07/2016 1:51 PM  Related encounter: ED to Hosp-Admission (Discharged) from 03/04/2016 in [facility]     Expand All Collapse All   [] Hide copied text [] Hover for attribution information      Physical Medicine and Rehabilitation Consult   Reason for Consult: Central cord syndrome Referring Physician: Dr. Conchita Paris    HPI: Brian Petty is a 50 y.o. male who was admitted on 03/04/16 after fall off bar stool and was unresponsive at scene. He was responsive to stimuli on evaluation by EMS and had reports of sharp burning pin BUE, bilateral hand weakness and unsteady gait.  MRI spine with edema C6 with suspicion for fracture, cord contusion C5/6 with question of ligamentous injury and multilevel spondylosis most severe at C6-7. He was evaluated by Dr. 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  He  underwent ACDF C4-& on 03/06/16  He reports that his hand are feeling a little better. Lives alone but has friends who has handicapped accessible home and can provide supervision.    Review of Systems  Constitutional: Negative for malaise/fatigue.  HENT: Negative for hearing loss.        Stuffy nose  Eyes: Negative for blurred vision and double vision.  Respiratory: Negative for cough and shortness of breath.   Cardiovascular: Negative for chest pain and palpitations.  Gastrointestinal: Positive for constipation. Negative for heartburn, nausea and vomiting.  Genitourinary: Negative for dysuria and urgency.  Musculoskeletal: Positive for neck pain. Negative for myalgias.  Neurological: Positive for tingling, focal weakness and weakness. Negative for dizziness, speech change and headaches.  Psychiatric/Behavioral: Negative for memory loss. The patient is  not nervous/anxious and does not have insomnia.   All other systems reviewed and are negative.   History reviewed. No pertinent past medical history.         Past Surgical History:  Procedure Laterality Date  . ANTERIOR CERVICAL DECOMP/DISCECTOMY FUSION N/A 03/06/2016   Procedure: ANTERIOR CERVICAL DECOMPRESSION/DISCECTOMY FUSION CERVICAL FOUR- CERVICAL FIVE, CERVICAL FIVE- CERVICAL SIX, CERVICAL SIX- CERVICAL SEVEN;  Surgeon: Lisbeth Renshaw, MD;  Location: [facility];  Service: Neurosurgery;  Laterality: N/A;  ANTERIOR CERVICAL DECOMPRESSION/DISCECTOMY FUSION C4-C5, C5-C6, C6-C7    History reviewed. No pertinent family history.    Social History:  Divorced. Works as a Oncologist at [employer]. He reports that he has been smoking Cigarettes.  He has been smoking about 0.50 packs per day. He has never used smokeless tobacco. He reports that he drinks alcohol--couple of glasses of wine.  He reports that he does not use dru       Allergies  Allergen Reactions  . No Known Allergies          Medications Prior to Admission  Medication Sig Dispense Refill  . ibuprofen (ADVIL,MOTRIN) 200 MG tablet Take 600 mg by mouth every 6 (six) hours as needed (pain).    Marland Kitchen OVER THE COUNTER MEDICATION Place 1 drop into both eyes 3 (three) times daily as needed (dry eyes). Over the counter lubricating eye drop      Home: Home Living Family/patient expects to be discharged to:: Private residence Living Arrangements: Alone Available Help at Discharge: Friend(s), Available PRN/intermittently Type of Home: Apartment Home Access: Stairs to enter [employer] of Steps: 2 flights  Entrance Stairs-Rails: Right, Left, Can reach both Home Layout: One level Bathroom  Shower/Tub: Tub/shower unit, Mining engineerCurtain Bathroom Accessibility: Yes Home Equipment: None  Functional History: Prior Function Level of Independence: Independent 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; drives Functional  Status:  Mobility: Bed Mobility Overal bed mobility: Needs Assistance Bed Mobility: Rolling, Sidelying to Sit Rolling: Min assist Sidelying to sit: Min assist General bed mobility comments: v/c's for rolling to the R and using R elbow and L hand to push up, minA for trunk elevation, increased time Transfers Overall transfer level: Needs assistance Equipment used: 2 person hand held assist Transfers: Sit to/from Stand Sit to Stand: Min assist, +2 physical assistance Stand pivot transfers: Min assist, +2 physical assistance General transfer comment: pt unsteady upon initital stand requiring minA to maintain balance Ambulation/Gait Ambulation/Gait assistance: Min assist, +2 physical assistance Ambulation Distance (Feet): 60 Feet Assistive device: 2 person hand held assist Gait Pattern/deviations: Step-through pattern, Decreased stride length General Gait Details: pt very unsteady with R lateral leaning requiring assist to prevent fall. Pt with crossover gait pattern especially with LOB in effort to catch self. unable to use UEs to hold RW or to prevent fall at this time Gait velocity: decreased Gait velocity interpretation: Below normal speed for age/gender    ADL: ADL Overall ADL's : Needs assistance/impaired Eating/Feeding: Minimal assistance, Sitting Eating/Feeding Details (indicate cue type and reason): difficulty managing utensils, keeping food on plate, opening packages Grooming: Moderate assistance, Sitting Upper Body Bathing: Minimal assitance, Sitting Lower Body Bathing: Minimal assistance, Sit to/from stand Upper Body Dressing : Moderate assistance, Sitting Lower Body Dressing: Moderate assistance, Sit to/from stand Lower Body Dressing Details (indicate cue type and reason): difficulty with grasping waistband with R hand Toilet Transfer: Minimal assistance, Ambulation, Comfort height toilet Toileting- Clothing Manipulation and Hygiene: Minimal assistance Functional  mobility during ADLs: Minimal assistance (unsteady with ambulation/scissors at times) General ADL Comments: Issued pt tubing to build up utensils and toothbrush. Pt asking about return of UE function.   Cognition: Cognition Overall Cognitive Status:  (appears WFL. Will further assess) Orientation Level: Oriented X4 Cognition Arousal/Alertness: Awake/alert Behavior During Therapy: WFL for tasks assessed/performed Overall Cognitive Status:  (appears WFL. Will further assess)  Blood pressure (!) 152/87, pulse 62, temperature 97.9 F (36.6 C), temperature source Oral, resp. rate 16, height 5\' 11"  (1.803 m), weight 72.6 kg (160 lb), SpO2 100 %. Physical Exam  Vitals reviewed. Constitutional: He is oriented to person, place, and time. He appears well-developed and well-nourished.  HENT:  Right Ear: External ear normal.  Left Ear: External ear normal.  Eyes: EOM are normal. Right eye exhibits no discharge. Left eye exhibits no discharge.  Neck:  C-collar in place  Cardiovascular: Normal rate and regular rhythm.   Respiratory: Effort normal and breath sounds normal.  GI: Soft. Bowel sounds are normal.  Musculoskeletal: He exhibits no edema or tenderness.  Neurological: He is alert and oriented to person, place, and time.  Motor: B/l UE: Should abduction, elbow flexion/extension wrist extension 4/5, hand grip 2+/5 B/l LE: 5/5 proximal to distal DTRs symmetric Sensation intact to light touch  Skin: Skin is warm and dry.  Surgical dressing c/d/i  Psychiatric: He has a normal mood and affect. His behavior is normal. Thought content normal.    Lab Results Last 24 Hours  No results found for this or any previous visit (from the past 24 hour(s)).    Imaging Results (Last 48 hours)  Dg Cervical Spine 2-3 Views  Result Date: 03/06/2016 CLINICAL DATA:  ACDF C4-C7 EXAM: DG C-ARM 61-120 MIN; CERVICAL SPINE -  2-3 VIEW COMPARISON:  None. FINDINGS: Changes of 3 level ACDF from C4-C7 noted  on these intraoperative spot images. Normal alignment. No hardware complicating feature. IMPRESSION: C4-C7 ACDF.  No complicating feature. Electronically Signed   By: Charlett Nose M.D.   On: 03/06/2016 16:42   Dg C-arm 1-60 Min  Result Date: 03/06/2016 CLINICAL DATA:  ACDF C4-C7 EXAM: DG C-ARM 61-120 MIN; CERVICAL SPINE - 2-3 VIEW COMPARISON:  None. FINDINGS: Changes of 3 level ACDF from C4-C7 noted on these intraoperative spot images. Normal alignment. No hardware complicating feature. IMPRESSION: C4-C7 ACDF.  No complicating feature. Electronically Signed   By: Charlett Nose M.D.   On: 03/06/2016 16:42     Assessment/Plan: Diagnosis: Central cord syndrome Labs and images independently reviewed.  Records reviewed and summated above.     Psych: psychology consult for adjustment to disability for pt and family     Spasticity: may develop spasticity. Manage spasticity only if indicated     (pain, hygiene, prevention of contractures, functional impairment).     Pain Management:  control with oral medications if possible     Bladder:  would expect development of neurogenic bladder as when spasticity starts to develop. May confirm this with serial PVRs to r/o retention/atonic bladder. Will continue in/out clean catherization. Implement bladder program . Encourage self I&O cath training vs     indwelling foley if possible to improve mobility, reduce infection, and     increase safety     Bowel: Continue stress ulcer ppx..  Implement mechanical and chemical bowel program and care     training with scheduled suppository 30 min to 1 hour after meals to utilize     gastrocolic and colorectal reflexes.   1. Does the need for close, 24 hr/day medical supervision in concert with the patient's rehab needs make it unreasonable for this patient to be served in a less intensive setting? Yes 2. Co-Morbidities requiring supervision/potential complications: HTN (monitor and provide prns in accordance with increased  physical exertion and pain), hyponatremia (cont to monitor, treat if necessary, without correcting too aggressively), post-op pain (Biofeedback training with therapies to help reduce reliance on opiate pain medications, monitor pain control during therapies, and sedation at rest and titrate to maximum efficacy to ensure participation and gains in therapies) 3. Due to bladder management, bowel management, safety, skin/wound care, disease management, medication administration, pain management and patient education, does the patient require 24 hr/day rehab nursing? Yes 4. Does the patient require coordinated care of a physician, rehab nurse, PT (1-2 hrs/day, 5 days/week), OT (1-2 hrs/day, 5 days/week) and SLP (1-2 hrs/day, 5 days/week) to address physical and functional deficits in the context of the above medical diagnosis(es)? Yes Addressing deficits in the following areas: balance, endurance, locomotion, strength, transferring, bathing, dressing, feeding, grooming, toileting, swallowing and psychosocial support 5. Can the patient actively participate in an intensive therapy program of at least 3 hrs of therapy per day at least 5 days per week? Yes 6. The potential for patient to make measurable gains while on inpatient rehab is excellent 7. Anticipated functional outcomes upon discharge from inpatient rehab are modified independent and supervision  with PT, supervision and min assist with OT, independent and modified independent with SLP. 8. Estimated rehab length of stay to reach the above functional goals is: 14-18 days. 9. Does the patient have adequate social supports and living environment to accommodate these discharge functional goals? Potentially 10. Anticipated D/C setting: Home 11. Anticipated post D/C treatments: HH therapy  and Home excercise program 12. Overall Rehab/Functional Prognosis: good  RECOMMENDATIONS: This patient's condition is appropriate for continued rehabilitative care in  the following setting: CIR Patient has agreed to participate in recommended program. Yes Note that insurance prior authorization may be required for reimbursement for recommended care.  Comment: Rehab Admissions Coordinator to follow up.  Maryla MorrowAnkit Patel, MD 03/07/2016

## 2016-03-09 NOTE — H&P (View-Only) (Signed)
Physical Medicine and Rehabilitation Admission H&P    Chief Complaint  Patient presents with  . Central cord syndrome.    HPI: Brian Petty is a 50 y.o. male who was admitted on 03/04/16 after fall off bar stool and was unresponsive at scene. He was responsive to stimuli on evaluation by EMS and had reports of sharp burning pain BUE, bilateral hand weakness and unsteady gait.  ETOH level 119.  MRI spine with edema C6 with suspicion for fracture, cord contusion C5/6 with question of ligamentous injury and multilevel spondylosis most severe at C6-7. He was evaluated by Dr. 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  He  underwent decompression and  ACDF C4-7 on 03/06/16 and post op has had improvement in BUE strength and decrease in neuropathy.  He did report problems with swallowing and difficulty with liquids. Patient with difficulty with ADL tasks and balance deficits. CIR recommended for follow up therapy.     Review of Systems  Constitutional: Negative for malaise/fatigue.  HENT: Negative for hearing loss.   Eyes: Negative for blurred vision and double vision.  Respiratory: Negative for cough and shortness of breath.   Cardiovascular: Negative for chest pain, palpitations and leg swelling.  Gastrointestinal: Positive for constipation. Negative for heartburn and nausea.  Genitourinary: Negative for dysuria and urgency.  Musculoskeletal: Positive for myalgias and neck pain.  Skin: Negative for itching and rash.  Neurological: Positive for sensory change, focal weakness and weakness. Negative for dizziness, tingling and headaches.  Psychiatric/Behavioral: Negative for depression and memory loss. The patient does not have insomnia.       History reviewed. No pertinent past medical history.    Past Surgical History:  Procedure Laterality Date  . ANTERIOR CERVICAL DECOMP/DISCECTOMY FUSION N/A  03/06/2016   Procedure: ANTERIOR CERVICAL DECOMPRESSION/DISCECTOMY FUSION CERVICAL FOUR- CERVICAL FIVE, CERVICAL FIVE- CERVICAL SIX, CERVICAL SIX- CERVICAL SEVEN;  Surgeon: Lisbeth Renshaw, MD;  Location: [facility];  Service: Neurosurgery;  Laterality: N/A;  ANTERIOR CERVICAL DECOMPRESSION/DISCECTOMY FUSION C4-C5, C5-C6, C6-C7    History reviewed. No pertinent family history.    Social History:  reports that he has been smoking Cigarettes.  He has been smoking about 0.50 packs per day. He has never used smokeless tobacco. He reports that he drinks alcohol. He reports that he does not use drugs.    Allergies  Allergen Reactions  . No Known Allergies     Medications Prior to Admission  Medication Sig Dispense Refill  . ibuprofen (ADVIL,MOTRIN) 200 MG tablet Take 600 mg by mouth every 6 (six) hours as needed (pain).    Marland Kitchen OVER THE COUNTER MEDICATION Place 1 drop into both eyes 3 (three) times daily as needed (dry eyes). Over the counter lubricating eye drop      Home: Home Living Family/patient expects to be discharged to:: Private residence Living Arrangements: Alone Available Help at Discharge: Friend(s), Available PRN/intermittently Type of Home: Apartment Home Access: Stairs to enter [employer] of Steps: 2 flights  Entrance Stairs-Rails: Right, Left, Can reach both Home Layout: One level Bathroom Shower/Tub: Tub/shower unit, Corporate treasurer Accessibility: Yes Home Equipment: None   Functional History: Prior Function Level of Independence: Independent Comments: works as Manufacturing systems engineer; drives  Functional Status:  Mobility: Bed Mobility Overal bed mobility: Needs Assistance Bed Mobility: Rolling, Sidelying to Sit Rolling: Min assist Sidelying to sit: Min assist General bed mobility comments: up in the chair Transfers Overall transfer level: Needs  assistance Equipment used: 2 person hand held assist Transfers: Sit to/from Stand Sit to Stand: Min  assist Stand pivot transfers: Min assist, +2 physical assistance General transfer comment: Initial unsteadiness easy to manage with some control at hips. Ambulation/Gait Ambulation/Gait assistance: Min assist Ambulation Distance (Feet): 200 Feet Assistive device: 1 person hand held assist (iv pole) Gait Pattern/deviations: Step-through pattern General Gait Details: Unsteady gait with narrow BOS.  Worked on heel/toe with had the result of helping widen BOS some of the time.  Still scissored and buckled softly at times. Gait velocity: decreased Gait velocity interpretation: Below normal speed for age/gender    ADL: ADL Overall ADL's : Needs assistance/impaired Eating/Feeding: Minimal assistance, Sitting Eating/Feeding Details (indicate cue type and reason): difficulty managing utensils, keeping food on plate, opening packages Grooming: Moderate assistance, Sitting Upper Body Bathing: Minimal assitance, Sitting Lower Body Bathing: Minimal assistance, Sit to/from stand Upper Body Dressing : Moderate assistance, Sitting Lower Body Dressing: Moderate assistance, Sit to/from stand Lower Body Dressing Details (indicate cue type and reason): difficulty with grasping waistband with R hand Toilet Transfer: Minimal assistance, Ambulation, Comfort height toilet Toileting- Clothing Manipulation and Hygiene: Minimal assistance Functional mobility during ADLs: Minimal assistance (unsteady with ambulation/scissors at times) General ADL Comments: Issued pt tubing to build up utensils and toothbrush. Pt asking about return of UE function.   Cognition: Cognition Overall Cognitive Status: Within Functional Limits for tasks assessed Orientation Level: Oriented X4 Cognition Arousal/Alertness: Awake/alert Behavior During Therapy: WFL for tasks assessed/performed Overall Cognitive Status: Within Functional Limits for tasks assessed   Blood pressure (!) 145/85, pulse 85, temperature 98.1 F (36.7 C),  temperature source Oral, resp. rate 18, height 5\' 11"  (1.803 m), weight 72.6 kg (160 lb), SpO2 100 %. Physical Exam  Nursing note and vitals reviewed. Constitutional: He is oriented to person, place, and time. He appears well-developed and well-nourished. Cervical collar in place.  HENT:  Head: Normocephalic and atraumatic.  Mouth/Throat: Oropharynx is clear and moist.  Eyes: Conjunctivae and EOM are normal. Pupils are equal, round, and reactive to light.  Neck:  Honey comb dressing anterior neck--no drainage. Collar in place.   Cardiovascular: Normal rate and regular rhythm.   No murmur heard. Respiratory: Effort normal and breath sounds normal. No stridor. No respiratory distress. He has no wheezes.  GI: Soft. Bowel sounds are normal. He exhibits no distension. There is no tenderness.  Musculoskeletal: He exhibits edema (min edema bilateral hands).  Neurological: He is alert and oriented to person, place, and time. No cranial nerve deficit.  Soft voice --no evidence of dysphonia. Follows commands without difficulty.   Motor: B/L UE: Should abduction, elbow flexion  wrist  4 to 5/5, Right: tricep 2, wrist 2, HI trace Left: tricep 3-, wrist 4, HI 3- B/l LE: 5/5 proximal to distal DTRs symmetric Sensation decreased to light touch in both hands and distal arms. No allodynia or dysesthesias. LE sensation normal. DTR's 1+  Skin: Skin is warm and dry.  Psychiatric: He has a normal mood and affect. His behavior is normal. Judgment and thought content normal.    No results found for this or any previous visit (from the past 48 hour(s)). No results found.     Medical Problem List and Plan: 1.  Upper extremity weakness and functional/mobility deficits secondary to central cord syndrome (pt st/p decompression and ACDF C4-7) 2.  DVT Prophylaxis/Anticoagulation: Mechanical: Sequential compression devices, below knee Bilateral lower extremities. Increase mobility 3. Pain Management: Reports  ongoing dysesthesias   -titrate  gabapentin up to 300mg  TID 4. Mood: LCSW to follow for evaluation and support.  5. Neuropsych: This patient is capable of making decisions on his own behalf. 6. Skin/Wound Care: Monitor wound daily for healing/signs of infection.  7. Fluids/Electrolytes/Nutrition: Monitor I/O. Check lytes in am.   8.  Dysphagia: Will modify diet to dysphagia 2, nectar and have ST evaluate swallow for appropriate diet.  9. Constipation: Augment bowel program--will add miralax bid as Senna S ineffective.      Post Admission Physician Evaluation: 1. Functional deficits secondary  to central cord syndrome. 2. Patient is admitted to receive collaborative, interdisciplinary care between the physiatrist, rehab nursing staff, and therapy team. 3. Patient's level of medical complexity and substantial therapy needs in context of that medical necessity cannot be provided at a lesser intensity of care such as a SNF. 4. Patient has experienced substantial functional loss from his/her baseline which was documented above under the "Functional History" and "Functional Status" headings.  Judging by the patient's diagnosis, physical exam, and functional history, the patient has potential for functional progress which will result in measurable gains while on inpatient rehab.  These gains will be of substantial and practical use upon discharge  in facilitating mobility and self-care at the household level. 5. Physiatrist will provide 24 hour management of medical needs as well as oversight of the therapy plan/treatment and provide guidance as appropriate regarding the interaction of the two. 6. 24 hour rehab nursing will assist with bladder management, bowel management, safety, skin/wound care, disease management, medication administration, pain management and patient education  and help integrate therapy concepts, techniques,education, etc. 7. PT will assess and treat for/with: Lower extremity  strength, range of motion, stamina, balance, functional mobility, safety, adaptive techniques and equipment, NMR, pain mgt, education, community reintegration.   Goals are: mod I. 8. OT will assess and treat for/with: ADL's, functional mobility, safety, upper extremity strength, adaptive techniques and equipment, NMR, pain mgt, ego support, education.   Goals are: mod I to set up. Therapy may proceed with showering this patient. 9. SLP will assess and treat for/with: will hold on SLP but observe for any issues with swallowing---pt reports improvement. 10. Case Management and Social Worker will assess and treat for psychological issues and discharge planning. 11. Team conference will be held weekly to assess progress toward goals and to determine barriers to discharge. 12. Patient will receive at least 3 hours of therapy per day at least 5 days per week. 13. ELOS: 7-9 days       14. Prognosis:  excellent     Ranelle Oyster, MD, [facility] Physical Medicine & Rehabilitation 03/09/2016  03/09/2016

## 2016-03-09 NOTE — Progress Notes (Signed)
Pt being transferred to inpatient rehab per orders from MD. Pt educated on transfer and has verbalized understanding of transfer. Pt's IV remained intact and in use during transfer. Pt transferred from unit via wheelchair to 4W03. RN gave report via phone to male nurse on unit.

## 2016-03-10 ENCOUNTER — Inpatient Hospital Stay (HOSPITAL_COMMUNITY): Payer: BLUE CROSS/BLUE SHIELD | Admitting: Speech Pathology

## 2016-03-10 ENCOUNTER — Inpatient Hospital Stay (HOSPITAL_COMMUNITY): Payer: BLUE CROSS/BLUE SHIELD | Admitting: Physical Therapy

## 2016-03-10 ENCOUNTER — Encounter (HOSPITAL_COMMUNITY): Payer: Self-pay | Admitting: *Deleted

## 2016-03-10 ENCOUNTER — Inpatient Hospital Stay (HOSPITAL_COMMUNITY): Payer: BLUE CROSS/BLUE SHIELD

## 2016-03-10 LAB — COMPREHENSIVE METABOLIC PANEL
ALT: 20 U/L (ref 17–63)
ANION GAP: 9 (ref 5–15)
AST: 16 U/L (ref 15–41)
Albumin: 3.4 g/dL — ABNORMAL LOW (ref 3.5–5.0)
Alkaline Phosphatase: 69 U/L (ref 38–126)
BUN: 9 mg/dL (ref 6–20)
CALCIUM: 9.6 mg/dL (ref 8.9–10.3)
CO2: 26 mmol/L (ref 22–32)
CREATININE: 0.66 mg/dL (ref 0.61–1.24)
Chloride: 101 mmol/L (ref 101–111)
GLUCOSE: 104 mg/dL — AB (ref 65–99)
POTASSIUM: 3.9 mmol/L (ref 3.5–5.1)
Sodium: 136 mmol/L (ref 135–145)
TOTAL PROTEIN: 6.8 g/dL (ref 6.5–8.1)
Total Bilirubin: 0.6 mg/dL (ref 0.3–1.2)

## 2016-03-10 LAB — CBC WITH DIFFERENTIAL/PLATELET
BASOS ABS: 0 10*3/uL (ref 0.0–0.1)
Basophils Relative: 0 %
EOS PCT: 3 %
Eosinophils Absolute: 0.2 10*3/uL (ref 0.0–0.7)
HCT: 41.5 % (ref 39.0–52.0)
Hemoglobin: 13.8 g/dL (ref 13.0–17.0)
LYMPHS PCT: 25 %
Lymphs Abs: 1.5 10*3/uL (ref 0.7–4.0)
MCH: 29.7 pg (ref 26.0–34.0)
MCHC: 33.3 g/dL (ref 30.0–36.0)
MCV: 89.2 fL (ref 78.0–100.0)
MONO ABS: 0.5 10*3/uL (ref 0.1–1.0)
MONOS PCT: 9 %
Neutro Abs: 3.7 10*3/uL (ref 1.7–7.7)
Neutrophils Relative %: 63 %
PLATELETS: 229 10*3/uL (ref 150–400)
RBC: 4.65 MIL/uL (ref 4.22–5.81)
RDW: 12.6 % (ref 11.5–15.5)
WBC: 5.9 10*3/uL (ref 4.0–10.5)

## 2016-03-10 MED ORDER — BISACODYL 5 MG PO TBEC
10.0000 mg | DELAYED_RELEASE_TABLET | Freq: Once | ORAL | Status: AC
  Administered 2016-03-10: 10 mg via ORAL
  Filled 2016-03-10: qty 2

## 2016-03-10 MED ORDER — CETYLPYRIDINIUM CHLORIDE 0.05 % MT LIQD
7.0000 mL | Freq: Two times a day (BID) | OROMUCOSAL | Status: DC
  Administered 2016-03-10 – 2016-03-14 (×5): 7 mL via OROMUCOSAL

## 2016-03-10 NOTE — Progress Notes (Signed)
50 y.o.malewho was admitted on 03/04/16 after fall off bar stool and was unresponsive at scene. He was responsive to stimuli on evaluation by EMS and had reports of sharp burning pain BUE, bilateral hand weakness and unsteady gait.  ETOH level 119. MRI spine with edema C6 with suspicion for fracture, cord contusion C5/6 with question of ligamentous injury and multilevel spondylosis most severe at C6-7. He was evaluated by Dr. 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 He underwent decompression and  ACDF C4-7on 03/06/16 and post op has had improvement in BUE strength and decrease in neuropathy.  He did report problems with swallowing and difficulty with liquids  Subjective/Complaints: Patient noted to have some swallowing issues. Discussed with speech therapy. Also, some mild cognitive issues noted with speech therapy. Patient denies any new complaints today. Review of systems denies chest pain, shortness of breath, nausea, vomiting, diarrhea , positive constipation  Objective: Vital Signs: Blood pressure (!) 162/95, pulse 74, temperature 98.6 F (37 C), temperature source Oral, resp. rate 18, height 5' 11"  (1.803 m), weight 79.4 kg (175 lb), SpO2 100 %. No results found. Results for orders placed or performed during the hospital encounter of 03/09/16 (from the past 72 hour(s))  CBC WITH DIFFERENTIAL     Status: None   Collection Time: 03/10/16  5:45 AM  Result Value Ref Range   WBC 5.9 4.0 - 10.5 K/uL   RBC 4.65 4.22 - 5.81 MIL/uL   Hemoglobin 13.8 13.0 - 17.0 g/dL   HCT 41.5 39.0 - 52.0 %   MCV 89.2 78.0 - 100.0 fL   MCH 29.7 26.0 - 34.0 pg   MCHC 33.3 30.0 - 36.0 g/dL   RDW 12.6 11.5 - 15.5 %   Platelets 229 150 - 400 K/uL   Neutrophils Relative % 63 %   Neutro Abs 3.7 1.7 - 7.7 K/uL   Lymphocytes Relative 25 %   Lymphs Abs 1.5 0.7 - 4.0 K/uL   Monocytes Relative 9 %   Monocytes Absolute  0.5 0.1 - 1.0 K/uL   Eosinophils Relative 3 %   Eosinophils Absolute 0.2 0.0 - 0.7 K/uL   Basophils Relative 0 %   Basophils Absolute 0.0 0.0 - 0.1 K/uL  Comprehensive metabolic panel     Status: Abnormal   Collection Time: 03/10/16  5:45 AM  Result Value Ref Range   Sodium 136 135 - 145 mmol/L   Potassium 3.9 3.5 - 5.1 mmol/L   Chloride 101 101 - 111 mmol/L   CO2 26 22 - 32 mmol/L   Glucose, Bld 104 (H) 65 - 99 mg/dL   BUN 9 6 - 20 mg/dL   Creatinine, Ser 0.66 0.61 - 1.24 mg/dL   Calcium 9.6 8.9 - 10.3 mg/dL   Total Protein 6.8 6.5 - 8.1 g/dL   Albumin 3.4 (L) 3.5 - 5.0 g/dL   AST 16 15 - 41 U/L   ALT 20 17 - 63 U/L   Alkaline Phosphatase 69 38 - 126 U/L   Total Bilirubin 0.6 0.3 - 1.2 mg/dL   GFR calc non Af Amer >60 >60 mL/min   GFR calc Af Amer >60 >60 mL/min    Comment: (NOTE) The eGFR has been calculated using the CKD EPI equation. This calculation has not been validated in all clinical situations. eGFR's persistently <60 mL/min signify possible Chronic Kidney Disease.    Anion gap 9 5 - 15     HEENT: normal  Cardio: RRR and No murmur Resp: CTA B/L and Unlabored GI: BS positive and Nontender, nondistended Extremity:  Edema Mild dorsal hand Skin:   Intact Neuro: Alert/Oriented, Abnormal Sensory Reduced bilateral C6 C7 and Abnormal Motor 2 minus bilateral deltoid, biceps, triceps, finger flexors, extensors 3 minus at the hip flexor, knee extensor, ankle dorsal flexor Musc/Skel:  Neck tender and Other Cervical orthosis Gen. no acute distress   Assessment/Plan: 1. Functional deficits secondary to central cord syndrome with quadriparesis which require 3+ hours per day of interdisciplinary therapy in a comprehensive inpatient rehab setting. Physiatrist is providing close team supervision and 24 hour management of active medical problems listed below. Physiatrist and rehab team continue to assess barriers to discharge/monitor patient progress toward functional and medical  goals. FIM: Function - Bathing Body parts bathed by patient: Right arm, Left arm, Chest, Abdomen, Front perineal area, Right upper leg, Left upper leg, Right lower leg, Left lower leg Body parts bathed by helper: Buttocks, Back (assist for thoroughness) Assist Level: Touching or steadying assistance(Pt > 75%)  Function- Upper Body Dressing/Undressing What is the patient wearing?: Pull over shirt/dress Pull over shirt/dress - Perfomed by patient: Thread/unthread right sleeve, Thread/unthread left sleeve, Put head through opening, Pull shirt over trunk Assist Level: Supervision or verbal cues, Set up Set up : To obtain clothing/put away Function - Lower Body Dressing/Undressing What is the patient wearing?: Pants, Socks Position: Wheelchair/chair at sink Pants- Performed by patient: Thread/unthread right pants leg, Thread/unthread left pants leg, Pull pants up/down, Fasten/unfasten pants Socks - Performed by helper: Don/doff right sock, Don/doff left sock Assist for footwear: Maximal assist Assist for lower body dressing: Touching or steadying assistance (Pt > 75%) (min assist standing balance)  Function - Toileting Toileting steps completed by patient: Adjust clothing prior to toileting, Performs perineal hygiene, Adjust clothing after toileting Toileting Assistive Devices: Grab bar or rail Assist level: Touching or steadying assistance (Pt.75%)  Function - Air cabin crew transfer assistive device: Grab bar Assist level to toilet: Touching or steadying assistance (Pt > 75%) Assist level from toilet: Touching or steadying assistance (Pt > 75%)  Function - Chair/bed transfer Chair/bed transfer method: Ambulatory (hand held assist) Chair/bed transfer assist level: Touching or steadying assistance (Pt > 75%)     Function - Comprehension Comprehension: Auditory Comprehension assist level: Follows complex conversation/direction with no assist  Function -  Expression Expression: Verbal Expression assist level: Expresses complex ideas: With no assist  Function - Social Interaction Social Interaction assist level: Interacts appropriately with others - No medications needed.  Function - Problem Solving Problem solving assist level: Solves complex problems: Recognizes & self-corrects  Function - Memory Memory assist level: More than reasonable amount of time Patient normally able to recall (first 3 days only): Current season, Location of own room, Staff names and faces, That he or she is in a hospital  Medical Problem List and Plan: 1.  Upper extremity weakness and functional/mobility deficits secondary to central cord syndrome (pt st/p decompression and ACDF C4-7) 2.  DVT Prophylaxis/Anticoagulation: Mechanical: Sequential compression devices, below knee Bilateral lower extremities. Increase mobility 3. Pain Management: Reports ongoing dysesthesias                        -titrate gabapentin up to 312m TID 4. Mood: LCSW to follow for evaluation and support.  5. Neuropsych: This patient is capable of making decisions on his own behalf. 6. Skin/Wound Care: Monitor wound daily for healing/signs of infection.  7.  Fluids/Electrolytes/Nutrition: Monitor I/O. Check lytes in am.   8.  Dysphagia: Will modify diet to dysphagia 2, nectar and have ST evaluate swallow for appropriate diet.  9. Constipation: Augment bowel program--will add miralax bid as Senna S ineffective.    LOS (Days) 1 A FACE TO FACE EVALUATION WAS PERFORMED  KIRSTEINS,ANDREW E 03/10/2016, 11:48 AM

## 2016-03-10 NOTE — Evaluation (Signed)
Speech Language Pathology Assessment and Plan  Patient Details  Name: Brian Petty MRN: [id number] Date of Birth: Dec 20, 1965  SLP Diagnosis: Cognitive Impairments  Rehab Potential: Excellent ELOS: 10-12 days    Today's Date: 03/10/2016 SLP Individual Time: 0930-1030 SLP Individual Time Calculation (min): 60 min    Problem List:  Patient Active Problem List   Diagnosis Date Noted  . Closed nondisplaced fracture of sixth cervical vertebra ([place])   . Contusion of cervical cord ([place])   . Surgery, elective   . Central cord syndrome ([place])   . Neurogenic bowel   . Neurogenic bladder   . Benign essential HTN   . Hyponatremia   . Post-operative pain   . Spinal cord injury, C1-C7 ([place]) 03/04/2016   Past Medical History: History reviewed. No pertinent past medical history. Past Surgical History:  Past Surgical History:  Procedure Laterality Date  . ANTERIOR CERVICAL DECOMP/DISCECTOMY FUSION N/A 03/06/2016   Procedure: ANTERIOR CERVICAL DECOMPRESSION/DISCECTOMY FUSION CERVICAL FOUR- CERVICAL FIVE, CERVICAL FIVE- CERVICAL SIX, CERVICAL SIX- CERVICAL SEVEN;  Surgeon: Consuella Lose, MD;  Location: [facility];  Service: Neurosurgery;  Laterality: N/A;  ANTERIOR CERVICAL DECOMPRESSION/DISCECTOMY FUSION C4-C5, C5-C6, C6-C7    Assessment / Plan / Recommendation Clinical Impression Brian Petty is a 50 y.o. male who was admitted on 03/04/16 after fall off bar stool and was unresponsive at scene. He was responsive to stimuli on evaluation by EMS and had reports of sharp burning pain BUE, bilateral hand weakness and unsteady gait.  ETOH level 119.  MRI spine with edema C6 with suspicion for fracture, cord contusion C5/6 with question of ligamentous injury and multilevel spondylosis most severe at C6-7. He was evaluated by Dr. 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  He  underwent decompression and  ACDF  C4-7 on 03/06/16 and post op has had improvement in BUE strength and decrease in neuropathy.  He did report problems with swallowing and difficulty with liquids. Pt seen for cognitive-linguistic and swallow assessments following transfer to CIR. Pt demonstrates high level impairment in high level reasoning, memory, and executive functions which currently limits his ability to function independently. Pt demonstrates mild-mod pharyngeal phase dysphagia that should continue to improve as the post-op edema dissipates. Pt currently requires Dys 2 with nectar-thick liquids, meds whole with applesauce. Pt would benefit from SLP services to maximize functional independence with swallowing and cognitive-linguistic tasks.     Skilled Therapeutic Interventions          Pt was able to recall 2/5 novel items after a 5 minute delay. Functional math reasoning problems required mod A to complete secondary to difficulty with working memory- required step by step sequencing and repetition. Pt was initially unaware of cognitive limitations, but following assessment, awareness was significantly improved and pt verbalized motivation to address limitations.   SLP Assessment  Patient will need skilled Speech Lanaguage Pathology Services during CIR admission    Recommendations  SLP Diet Recommendations: Dysphagia 2 (Fine chop);Nectar Liquid Administration via: Cup;Straw Medication Administration: Whole meds with puree Supervision: Patient able to self feed;Intermittent supervision to cue for compensatory strategies Compensations: Slow rate;Small sips/bites;Clear throat intermittently Postural Changes and/or Swallow Maneuvers: Out of bed for meals Oral Care Recommendations: Oral care BID Recommendations for Other Services: Neuropsych consult Patient destination: Home Follow up Recommendations: Outpatient SLP Equipment Recommended: None recommended by SLP    SLP Frequency 3 to 5 out of 7 days   SLP Duration  SLP  Intensity  SLP Treatment/Interventions 10-12 days  Minumum of 1-2 x/day, 30 to 90 minutes  Cognitive remediation/compensation;Dysphagia/aspiration precaution training;Cueing hierarchy;Medication managment;Therapeutic Activities;Patient/family education;Functional tasks    Pain Pain Assessment Pain Assessment: No/denies pain  Prior Functioning Cognitive/Linguistic Baseline: Within functional limits Type of Home: Apartment  Lives With: Alone Available Help at Discharge: Friend(s);Available PRN/intermittently 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[employer])  Function:  Eating Eating   Modified Consistency Diet: Yes Eating Assist Level: Set up assist for Eating Assistive Device Comment: tubing applied to utensils for grasping skills Eating Set Up Assist For: Opening containers       Cognition Comprehension Comprehension assist level: Follows basic conversation/direction with extra time/assistive device  Expression   Expression assist level: Expresses basic needs/ideas: With extra time/assistive device  Social Interaction Social Interaction assist level: Interacts appropriately 90% of the time - Needs monitoring or encouragement for participation or interaction.  Problem Solving Problem solving assist level: Solves basic 90% of the time/requires cueing < 10% of the time  Memory Memory assist level: Recognizes or recalls 75 - 89% of the time/requires cueing 10 - 24% of the time   Short Term Goals: Week 1: SLP Short Term Goal 1 (Week 1): Pt complete complex level reasoning tasks at mod I. SLP Short Term Goal 2 (Week 1): Pt demonstrate recall of novel information at min A with use of compensatory strategies.  SLP Short Term Goal 3 (Week 1): Pt demonstrate ability to manage medications at mod I.  SLP Short Term Goal 4 (Week 1): Pt to tolerate trials of thin liquids without s/s aspiration in 3/3 trials to demonstrate readiness for upgrade vs MBSS. SLP Short Term Goal 5  (Week 1): Pt to tolerate upgraded diet textures with use of compensatory strategies at mod I.   Refer to Care Plan for Long Term Goals  Recommendations for other services: Neuropsych  Discharge Criteria: Patient will be discharged from SLP if patient refuses treatment 3 consecutive times without medical reason, if treatment goals not met, if there is a change in medical status, if patient makes no progress towards goals or if patient is discharged from hospital.  The above assessment, treatment plan, treatment alternatives and goals were discussed and mutually agreed upon: by patient  Vinetta Bergamo MA, CCC-SLP 03/10/2016, 4:02 PM

## 2016-03-10 NOTE — Evaluation (Signed)
Physical Therapy Assessment and Plan  Patient Details  Name: Brian Petty MRN: [id number] Date of Birth: 1966-01-09  PT Diagnosis: Abnormal posture, Abnormality of gait, Coordination disorder, Hypotonia, Impaired sensation and Paralysis Rehab Potential: Good ELOS: 7-10 days    Today's Date: 03/10/2016 PT Individual Time: 1050-1204 PT Individual Time Calculation (min): 74 min     Problem List:  Patient Active Problem List   Diagnosis Date Noted  . Closed nondisplaced fracture of sixth cervical vertebra ([place])   . Contusion of cervical cord ([place])   . Surgery, elective   . Central cord syndrome ([place])   . Neurogenic bowel   . Neurogenic bladder   . Benign essential HTN   . Hyponatremia   . Post-operative pain   . Spinal cord injury, C1-C7 ([place]) 03/04/2016    Past Medical History: History reviewed. No pertinent past medical history. Past Surgical History:  Past Surgical History:  Procedure Laterality Date  . ANTERIOR CERVICAL DECOMP/DISCECTOMY FUSION N/A 03/06/2016   Procedure: ANTERIOR CERVICAL DECOMPRESSION/DISCECTOMY FUSION CERVICAL FOUR- CERVICAL FIVE, CERVICAL FIVE- CERVICAL SIX, CERVICAL SIX- CERVICAL SEVEN;  Surgeon: Brian Lose, MD;  Location: [facility];  Service: Neurosurgery;  Laterality: N/A;  ANTERIOR CERVICAL DECOMPRESSION/DISCECTOMY FUSION C4-C5, C5-C6, C6-C7    Assessment & Plan Clinical Impression: Patient is a 50 y.o.malewho was admitted on 03/04/16 after fall off bar stool and was unresponsive at scene. He was responsive to stimuli on evaluation by EMS and had reports of sharp burning pain BUE, bilateral hand weakness and unsteady gait.  ETOH level 119. MRI spine with edema C6 with suspicion for fracture, cord contusion C5/6 with question of ligamentous injury and multilevel spondylosis most severe at C6-7. He was evaluated by Dr. Kathyrn Petty who recommended surgical decompression due to central cord syndrome related to trauma superimposed on underlying  stenosis. He underwent decompression and  ACDF C4-7on 03/06/16 and post op has had improvement in BUE strength and decrease in neuropathy.  He did report problems with swallowing and difficulty with liquids. Patient with difficulty with ADL tasks and balance deficits. Patient transferred to CIR on 03/09/2016 .   Patient currently requires min with mobility secondary to muscle weakness and muscle paralysis and abnormal tone.  Prior to hospitalization, patient was independent  with mobility and lived with Alone in a [place] home.  Home access is 2 flights (approx 15 steps.) Stairs to enter.  Patient will benefit from skilled PT intervention to maximize safe functional mobility, minimize fall risk and decrease caregiver burden for planned discharge home alone.  Anticipate patient will benefit from follow up [place] at discharge.  PT - End of Session Activity Tolerance: Tolerates 30+ min activity without fatigue Endurance Deficit: Yes PT Assessment Rehab Potential (ACUTE/IP ONLY): Good Barriers to Discharge: Decreased caregiver support PT Patient demonstrates impairments in the following area(s): Balance;Motor PT Transfers Functional Problem(s): Bed Mobility;Bed to Chair;Car;Furniture;Floor PT Locomotion Functional Problem(s): Ambulation;Wheelchair Mobility;Stairs PT Plan PT Intensity: Minimum of 1-2 x/day ,45 to 90 minutes PT Frequency: 5 out of 7 days PT Duration Estimated Length of Stay: 7-10 days  PT Treatment/Interventions: Balance/vestibular training;Cognitive remediation/compensation;Discharge planning;Disease management/prevention;Community reintegration;Ambulation/gait training;DME/adaptive equipment instruction;Functional electrical stimulation;Functional mobility training;Neuromuscular re-education;Pain management;Patient/family education;Psychosocial support;Skin care/wound management;Splinting/orthotics;Stair training;Therapeutic Activities;Therapeutic Exercise;UE/LE Strength  taining/ROM;UE/LE Coordination activities;Visual/perceptual remediation/compensation;Wheelchair propulsion/positioning PT Transfers Anticipated Outcome(s):  Mod I with LRAD  PT Locomotion Anticipated Outcome(s): Mod I with LRAD  PT Recommendation Follow Up Recommendations: Outpatient PT;Home health PT Patient destination: Home Equipment Recommended: To be determined    Skilled Therapeutic Intervention PT instructed  patient in Evaluation and initiated treatment intervention; See below.  PT instructed patient in [place] and DGI; see below for results.   Patient demonstrates increased fall risk as noted by score of   44/56 on Berg Balance Scale.  (<36= high risk for falls, close to 100%; 37-45 significant >80%; 46-51 moderate >50%; 52-55 lower >25%) and score of 16 on DGI (<19 indicates increased fall risk)     Car transfer with stand pivot technique as listed below. Gait training for 230f with minA as listed below and Gait training with RW and supervision A from PT.   Patient returned to room and left sitting in recliner with call bell in reach.    PT Evaluation Precautions/Restrictions Precautions Precautions: Cervical;Fall Required Braces or Orthoses: Cervical Brace Cervical Brace: Hard collar Restrictions Weight Bearing Restrictions: No General   Vital Signs Pain Pain Assessment Pain Assessment: 0-10 Pain Score: 0-No pain Pain Type: Acute pain;Neuropathic pain Pain Location: Arm Pain Orientation: Right;Left Pain Descriptors / Indicators: Tingling Pain Onset: On-going Pain Intervention(s): Medication (See eMAR) Home Living/Prior Functioning Home Living Available Help at Discharge: Friend(s);Available PRN/intermittently Type of Home: Apartment Home Access: Stairs to enter Entrance Stairs-Number of Steps: 2 flights (approx 15 steps.)  Entrance Stairs-Rails: Right;Left (unable to reach both) Home Layout: One level Bathroom Shower/Tub: Tub/shower unit;Curtain BArmed forces operational officer Standard Bathroom Accessibility: Yes  Lives With: Alone Prior Function Level of Independence: Independent with basic ADLs;Independent with homemaking with ambulation;Independent with transfers;Independent with gait (hired assistance for cleaning)  Able to Take Stairs?: Yes Driving: Yes 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  Leisure: Hobbies-yes (Comment) Comments: traveling Vision/Perception     Cognition Overall Cognitive Status: Within Functional Limits for tasks assessed Arousal/Alertness: Awake/alert Orientation Level: Oriented X4 Attention: Alternating Memory: Appears intact Awareness: Appears intact Problem Solving: Appears intact Safety/Judgment: Appears intact Sensation Sensation Light Touch: Impaired by gross assessment Proprioception: Appears Intact (in BLE ) Coordination Gross Motor Movements are Fluid and Coordinated: Yes (in BLE ) Fine Motor Movements are Fluid and Coordinated: No Heel Shin Test: WFL BLE.  Motor    abnormal decreased tone in BUE.  Mobility Bed Mobility Bed Mobility: Rolling Right;Rolling Left;Supine to Sit;Sit to Supine Rolling Right: 5: Supervision Rolling Right Details: Verbal cues for precautions/safety Rolling Left: 5: Supervision Rolling Left Details: Verbal cues for precautions/safety Supine to Sit: 4: Min assist Supine to Sit Details: Verbal cues for technique;Verbal cues for precautions/safety;Tactile cues for weight shifting Sit to Supine: 5: Supervision Transfers Transfers: Yes Sit to Stand: 4: Min assist Sit to Stand Details: Verbal cues for technique;Verbal cues for precautions/safety Stand to Sit: 4: Min assist Stand to Sit Details (indicate cue type and reason): Verbal cues for precautions/safety;Verbal cues for technique Stand Pivot Transfers: 4: Min assist Stand Pivot Transfer Details: Verbal cues for precautions/safety;Verbal cues for technique;Verbal cues for safe use of  DME/AE Locomotion  Ambulation Ambulation: Yes Ambulation/Gait Assistance: 4: Min assist Ambulation Distance (Feet): 250 Feet Assistive device: None Ambulation/Gait Assistance Details: Verbal cues for gait pattern;Verbal cues for precautions/safety;Verbal cues for technique Gait Gait: Yes Gait Pattern: Impaired Stairs / Additional Locomotion Stairs: Yes Stairs Assistance: 4: Min assist Stairs Assistance Details: Verbal cues for technique;Verbal cues for precautions/safety;Verbal cues for gait pattern Stair Management Technique: Two rails Number of Stairs: 12 Height of Stairs: 3 (8(3") and 4(6")) Wheelchair Mobility Wheelchair Mobility: Yes Wheelchair Assistance: 5: SCareers information officer Both upper extremities Wheelchair Parts Management: Needs assistance Distance: 1596f  Trunk/Postural Assessment  Cervical Assessment Cervical  Assessment: Exceptions to [facility] (cervical precautions) Thoracic Assessment Thoracic Assessment: Within Functional Limits Lumbar Assessment Lumbar Assessment: Within Functional Limits Postural Control Postural Control: Deficits on evaluation  Balance Balance Balance Assessed: Yes Standardized Balance Assessment Standardized Balance Assessment: Berg Balance Test;Dynamic Gait Index Berg Balance Test Sit to Stand: Able to stand  independently using hands Standing Unsupported: Able to stand safely 2 minutes Sitting with Back Unsupported but Feet Supported on Floor or Stool: Able to sit safely and securely 2 minutes Stand to Sit: Sits safely with minimal use of hands Transfers: Able to transfer safely, definite need of hands Standing Unsupported with Eyes Closed: Able to stand 10 seconds with supervision Standing Ubsupported with Feet Together: Able to place feet together independently and stand for 1 minute with supervision From Standing, Reach Forward with Outstretched Arm: Can reach confidently >25 cm (10") From Standing Position, Pick up  Object from Floor: Able to pick up shoe, needs supervision From Standing Position, Turn to Look Behind Over each Shoulder: Looks behind from both sides and weight shifts well Turn 360 Degrees: Able to turn 360 degrees safely but slowly Standing Unsupported, Alternately Place Feet on Step/Stool: Able to complete 4 steps without aid or supervision Standing Unsupported, One Foot in Front: Able to plae foot ahead of the other independently and hold 30 seconds Standing on One Leg: Able to lift leg independently and hold equal to or more than 3 seconds Total Score: 44 Dynamic Gait Index Level Surface: Mild Impairment Change in Gait Speed: Mild Impairment Gait with Horizontal Head Turns: Mild Impairment Gait with Vertical Head Turns: Moderate Impairment Gait and Pivot Turn: Mild Impairment Step Over Obstacle: Mild Impairment Step Around Obstacles: Mild Impairment Steps: Mild Impairment Total Score: 15 Static Sitting Balance Static Sitting - Level of Assistance: 6: Modified independent (Device/Increase time) Dynamic Sitting Balance Dynamic Sitting - Level of Assistance: 6: Modified independent (Device/Increase time) Static Standing Balance Static Standing - Balance Support: Bilateral upper extremity supported Static Standing - Level of Assistance: 5: Stand by assistance Dynamic Standing Balance Dynamic Standing - Balance Support: During functional activity Dynamic Standing - Level of Assistance: 4: Min assist Extremity Assessment  RUE Assessment RUE Assessment: Exceptions to [facility] RUE AROM (degrees) Right Shoulder Flexion: 100 Degrees Right Elbow Flexion: 150 Right Thumb Opposition: Digit 2 (unable to complete to remaining digits) RUE PROM (degrees) Overall PROM Right Upper Extremity: Within functional limits for tasks performed RUE Strength Right Shoulder Flexion: 3+/5 Right Elbow Flexion: 4/5 Right Elbow Extension: 4/5 Right Hand Gross Grasp: Impaired LUE Assessment LUE Assessment:  Exceptions to [facility] [place] (shoulder strength 3+/5; decreased grip strength) LUE AROM (degrees) Left Shoulder Flexion: 110 Degrees Left Elbow Flexion:  (WFL) Left Elbow Extension:  (WFL) Left Thumb Opposition: Digit 2;Digit 3;Digit 4;Digit 5 LUE PROM (degrees) Overall PROM Left Upper Extremity: Within functional limits for tasks assessed RLE Assessment RLE Assessment: Exceptions to [facility] (4-/5 hip flexion. 4+/5 hip abduction. all other motions 5/5) LLE Assessment LLE Assessment: Within Functional Limits (4+/5 hip flexion and hip abduction. all other LE mmt 5/5)   See Function Navigator for Current Functional Status.   Refer to Care Plan for Long Term Goals  Recommendations for other services: None  Discharge Criteria: Patient will be discharged from PT if patient refuses treatment 3 consecutive times without medical reason, if treatment goals not met, if there is a change in medical status, if patient makes no progress towards goals or if patient is discharged from hospital.  The above assessment, treatment plan, treatment  alternatives and goals were discussed and mutually agreed upon: by patient  Lorie Phenix 03/10/2016, 12:50 PM

## 2016-03-10 NOTE — Plan of Care (Signed)
Problem: SCI BOWEL ELIMINATION Goal: RH STG MANAGE BOWEL WITH ASSISTANCE STG Manage Bowel with mod.I.  Outcome: Not Progressing LBM 8/12- refusing suppository at this time- requesting dulcolax tablet

## 2016-03-10 NOTE — Progress Notes (Signed)
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 Closed nondisplaced fracture of sixth cervical vertebra ([place])   . 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 Surgery, elective   . Central cord syndrome ([place])   . Neurogenic bowel   . Neurogenic bladder   . Benign essential HTN   . Hyponatremia   . Post-operative pain   . Spinal cord injury, C1-C7 ([place]) 03/04/2016    Past Medical History: History reviewed. No pertinent past medical history. Past Surgical History:  Past Surgical History:  Procedure Laterality Date  . ANTERIOR CERVICAL DECOMP/DISCECTOMY FUSION N/A 03/06/2016   Procedure: ANTERIOR CERVICAL DECOMPRESSION/DISCECTOMY FUSION CERVICAL FOUR- CERVICAL FIVE, CERVICAL FIVE- CERVICAL SIX, CERVICAL SIX- CERVICAL SEVEN;  Surgeon: Consuella Lose, MD;  Location: [facility];  Service: Neurosurgery;  Laterality: N/A;  ANTERIOR CERVICAL DECOMPRESSION/DISCECTOMY FUSION C4-C5, C5-C6, C6-C7    Assessment & Plan Clinical Impression: Brian Petty a 50 y.o.malewho was admitted on 03/04/16 after fall off bar stool and was unresponsive at scene. He was responsive to stimuli on evaluation by EMS and had reports of sharp burning pain BUE, bilateral hand weakness and unsteady gait.  ETOH level 119. MRI spine with edema C6 with suspicion for fracture, cord contusion C5/6 with question of ligamentous injury and multilevel spondylosis most severe at C6-7. He was evaluated by Dr. Kathyrn Sheriff who recommended surgical decompression due to central cord syndrome related to trauma superimposed on underlying  stenosis. He underwent decompression and  ACDF C4-7on 03/06/16 and post op has had improvement in BUE strength and decrease in neuropathy.  He did report problems with swallowing and difficulty with liquids. Patient with difficulty with ADL tasks and balance deficits. Patient transferred to CIR on 03/09/2016 .    Patient currently requires min with basic self-care skills secondary to muscle weakness, decreased cardiorespiratoy endurance, unbalanced muscle activation and decreased coordination and decreased standing balance, decreased postural control and decreased balance strategies.  Prior to hospitalization, patient could complete BADLs with independent .  Patient will benefit from skilled intervention to increase independence with basic self-care skills prior to discharge home independently.  Anticipate patient will require intermittent supervision and TBD.  OT - End of Session Activity Tolerance: Decreased this session Endurance Deficit: Yes OT Assessment Rehab Potential (ACUTE ONLY): Good Barriers to Discharge: Decreased caregiver support OT Patient demonstrates impairments in the following area(s): Balance;Cognition;Endurance;Sensory;Safety;Motor;Pain OT Basic ADL's Functional Problem(s): Grooming;Bathing;Dressing;Toileting OT Advanced ADL's Functional Problem(s): Simple Meal Preparation OT Transfers Functional Problem(s): Toilet;Tub/Shower OT Additional Impairment(s): Fuctional Use of Upper Extremity OT Plan OT Intensity: Minimum of 1-2 x/day, 45 to 90 minutes OT Frequency: 5 out of 7 days OT Duration/Estimated Length of Stay: 7-9 days OT Treatment/Interventions: Balance/vestibular training;Cognitive remediation/compensation;Community reintegration;Discharge planning;Neuromuscular re-education;Functional electrical stimulation;Functional mobility training;Patient/family education;Self Care/advanced ADL retraining;Visual/perceptual remediation/compensation;UE/LE Coordination  activities;UE/LE Strength taining/ROM;Therapeutic Exercise;Therapeutic Activities;Splinting/orthotics OT Self Feeding Anticipated Outcome(s): Mod I OT Basic Self-Care Anticipated Outcome(s):  Mod I  OT Toileting Anticipated Outcome(s): Mod I OT Bathroom Transfers Anticipated Outcome(s): Mod I OT Recommendation Patient destination: Home Follow Up Recommendations: Other (comment) (TBD) Equipment Recommended: To be determined   Skilled Therapeutic Intervention  OT  evaluation completed. Discussed role of OT goals of therapy, possible ELOS, safety plan, precautions, fall risk, and DME. Pt completed ambulation short, household distances with min HHA. Pt also completed all stand pivot transfers with min A. Pt demonstrated decreased UB strength and coordination, specifically with fine motor skills. Pt demonstrates weakness in RUE>LUE. Pt demonstrated good safety awareness throughout session therefore left sitting in w/c with all needs in reach.   OT Evaluation Precautions/Restrictions  Precautions Precautions: Cervical;Fall Required Braces or Orthoses: Cervical Brace Cervical Brace: Hard collar Restrictions Weight Bearing Restrictions: No General   Vital Signs  Pain Pain Assessment Pain Assessment: 0-10 Pain Score: 7  Pain Type: Acute pain;Neuropathic pain Pain Location: Arm Pain Orientation: Right;Left Pain Descriptors / Indicators: Tingling Pain Onset: On-going Pain Intervention(s): Medication (See eMAR) Home Living/Prior Functioning Home Living Available Help at Discharge: Friend(s), Available PRN/intermittently Type of Home: Apartment Home Access: Stairs to enter Technical brewer of Steps: 2 flights  Entrance Stairs-Rails: Right, Left, Can reach both Home Layout: One level Bathroom Shower/Tub: Tub/shower unit, Architectural technologist: Standard Bathroom Accessibility: Yes  Lives With: Alone IADL History Occupation: Full time employment Type of Occupation: Information systems manager Prior Function Level of Independence: Independent with basic ADLs, Independent with homemaking with ambulation, Independent with transfers, Independent with gait (hired assistance for cleaning)  Able to Take Stairs?: Yes Driving: Yes Vocation: Full time employment Leisure: Hobbies-yes (Comment) Comments: traveling ADL   Vision/Perception  Vision- History Baseline Vision/History: Wears glasses Wears Glasses: At all times Patient Visual Report: No change from baseline Vision- Assessment Vision Assessment?: No apparent visual deficits  Cognition Overall Cognitive Status: Within Functional Limits for tasks assessed Arousal/Alertness: Awake/alert Orientation Level: Person;Place;Situation Person: Oriented Place: Oriented Situation: Oriented Year: 2017 Month: August Day of Week: Correct Memory: Appears intact Immediate Memory Recall: Sock;Blue;Bed Memory Recall: Sock;Blue;Bed Memory Recall Sock: Without Cue Memory Recall Blue: Without Cue Memory Recall Bed: Without Cue Attention: Alternating Awareness: Appears intact Problem Solving: Appears intact Safety/Judgment: Appears intact Sensation Sensation Light Touch: Impaired Detail Light Touch Impaired Details: Impaired RUE;Impaired LUE;Impaired RLE;Impaired LLE Hot/Cold: Impaired by gross assessment Proprioception: Appears Intact Coordination Gross Motor Movements are Fluid and Coordinated: No Fine Motor Movements are Fluid and Coordinated: No Finger Nose Finger Test: delayed with LUE and unable to complete with RUE 9 Hole Peg Test: RUE: 2:56 and LUE: 43 seconds Motor    Mobility  Bed Mobility Bed Mobility: Supine to Sit Supine to Sit: 4: Min assist Supine to Sit Details: Manual facilitation for weight shifting;Verbal cues for precautions/safety;Verbal cues for sequencing Transfers Transfers: Sit to Stand;Stand to Sit Sit to Stand: 4: Min assist Sit to Stand Details: Verbal cues for sequencing;Verbal cues for  technique;Manual facilitation for weight bearing Stand to Sit: 4: Min assist Stand to Sit Details (indicate cue type and reason): Manual facilitation for weight bearing;Verbal cues for sequencing;Verbal cues for technique  Trunk/Postural Assessment  Cervical Assessment Cervical Assessment: Exceptions to [facility] (cervical precautions) Thoracic Assessment Thoracic Assessment: Within Functional Limits Lumbar Assessment Lumbar Assessment: Within Functional Limits Postural Control Postural Control: Deficits on evaluation  Balance Balance Balance Assessed: Yes Static Standing Balance Static Standing - Balance Support: Bilateral upper extremity supported Static Standing - Level of Assistance: 4: Min assist Dynamic Standing Balance Dynamic Standing - Balance Support: During functional activity Dynamic Standing - Level of Assistance: 4: Min assist Extremity/Trunk Assessment RUE Assessment RUE Assessment: Exceptions to [facility] 2 RUE AROM (degrees) Right Shoulder Flexion: 100 Degrees Right Elbow Flexion: 150 Right Thumb Opposition: Digit 2 (unable  to complete to remaining digits) RUE PROM (degrees) Overall PROM Right Upper Extremity: Within functional limits for tasks performed RUE Strength Right Shoulder Flexion: 3+/5 Right Elbow Flexion: 4/5 Right Elbow Extension: 4/5 Right Hand Gross Grasp: Impaired LUE Assessment LUE Assessment: Exceptions to [facility] [place] (shoulder strength 3+/5; decreased grip strength) LUE AROM (degrees) Left Shoulder Flexion: 110 Degrees Left Elbow Flexion:  (WFL) Left Elbow Extension:  (WFL) Left Thumb Opposition: Digit 2;Digit 3;Digit 4;Digit 5 LUE PROM (degrees) Overall PROM Left Upper Extremity: Within functional limits for tasks assessed   See Function Navigator for Current Functional Status.   Refer to Care Plan for Long Term Goals  Recommendations for other services: None  Discharge Criteria: Patient will be discharged from OT if patient refuses treatment 3  consecutive times without medical reason, if treatment goals not met, if there is a change in medical status, if patient makes no progress towards goals or if patient is discharged from hospital.  The above assessment, treatment plan, treatment alternatives and goals were discussed and mutually agreed upon: by patient  Duayne Cal 03/10/2016, 9:26 AM

## 2016-03-11 ENCOUNTER — Inpatient Hospital Stay (HOSPITAL_COMMUNITY): Payer: BLUE CROSS/BLUE SHIELD | Admitting: Physical Therapy

## 2016-03-11 DIAGNOSIS — K592 Neurogenic bowel, not elsewhere classified: Secondary | ICD-10-CM

## 2016-03-11 MED ORDER — BISACODYL 10 MG RE SUPP
10.0000 mg | Freq: Every day | RECTAL | Status: DC
  Administered 2016-03-13: 10 mg via RECTAL
  Filled 2016-03-11 (×4): qty 1

## 2016-03-11 NOTE — Progress Notes (Signed)
Physical Therapy Session Note  Patient Details  Name: Brian Petty MRN: [id number] Date of Birth: 1965/09/01  Today's Date: 03/11/2016 PT Individual Time: 1419-1500 PT Individual Time Calculation (min): 41 min    Short Term Goals: Week 1:  PT Short Term Goal 1 (Week 1): STG=LTG due ELOS   Skilled Therapeutic Interventions/Progress Updates:    Pt received in recliner & agreeable to PT, noting 6/10 burning pain in B hands/arms but reports being premedicated. PT & RN adjusted pt's cervical collar for increased tightness. Gait training x 200 ft room>gym with RW & min A. Pt demonstrates inconsistent gait pattern with occasional scissoring gait causing loss of balance & min A to correct. Reviewed pt's cervical precautions with pt independently recalling 2/3 and recalled the last one with questioning cues. Session focused on BUE fine motor control with pt sorting through small objects (plastic bugs of various color), assembling puzzle, grasping coins to play connect four and folding a towel with focus on grasping each individual object to pick it up. Pt reported he cannot find comfortable position to sit in while in recliner; discussed various positions for increased comfort. Gait training x 200 ft back to room with RW & min A. At end of session pt left sitting in recliner with all needs within reach.   Pt reported it was easier to perform tasks with his hands on this date compared to yesterday.   Therapy Documentation Precautions:  Precautions Precautions: Cervical, Fall Required Braces or Orthoses: Cervical Brace Cervical Brace: Hard collar Restrictions Weight Bearing Restrictions: No  Pain: Pain Assessment Pain Assessment: 0-10 Pain Score: 6  Pain Location: Arm (hands) Pain Orientation: Left;Right Pain Descriptors / Indicators: Burning Pain Intervention(s):  (premedicated)  See Function Navigator for Current Functional Status.   Therapy/Group: Individual Therapy  Sandi MariscalVictoria M  Mikael Debell 03/11/2016, 5:13 PM

## 2016-03-11 NOTE — Progress Notes (Signed)
50 y.o.malewho was admitted on 03/04/16 after fall off bar stool and was unresponsive at scene. He was responsive to stimuli on evaluation by EMS and had reports of sharp burning pain BUE, bilateral hand weakness and unsteady gait.  ETOH level 119. MRI spine with edema C6 with suspicion for fracture, cord contusion C5/6 with question of ligamentous injury and multilevel spondylosis most severe at C6-7. He was evaluated by Dr. 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 He underwent decompression and  ACDF C4-7on 03/06/16 and post op has had improvement in BUE strength and decrease in neuropathy.  He did report problems with swallowing and difficulty with liquids  Subjective/Complaints: Patient noted to have some swallowing issues. Discussed with speech therapy. Also, some mild cognitive issues noted with speech therapy. Patient denies any new complaints today. Review of systems denies chest pain, shortness of breath, nausea, vomiting, diarrhea , positive constipation  Objective: Vital Signs: Blood pressure (!) 160/96, pulse 77, temperature 97.9 F (36.6 C), temperature source Oral, resp. rate 18, height 5' 11"  (1.803 m), weight 79.4 kg (175 lb), SpO2 97 %. No results found. Results for orders placed or performed during the hospital encounter of 03/09/16 (from the past 72 hour(s))  CBC WITH DIFFERENTIAL     Status: None   Collection Time: 03/10/16  5:45 AM  Result Value Ref Range   WBC 5.9 4.0 - 10.5 K/uL   RBC 4.65 4.22 - 5.81 MIL/uL   Hemoglobin 13.8 13.0 - 17.0 g/dL   HCT 41.5 39.0 - 52.0 %   MCV 89.2 78.0 - 100.0 fL   MCH 29.7 26.0 - 34.0 pg   MCHC 33.3 30.0 - 36.0 g/dL   RDW 12.6 11.5 - 15.5 %   Platelets 229 150 - 400 K/uL   Neutrophils Relative % 63 %   Neutro Abs 3.7 1.7 - 7.7 K/uL   Lymphocytes Relative 25 %   Lymphs Abs 1.5 0.7 - 4.0 K/uL   Monocytes Relative 9 %   Monocytes Absolute  0.5 0.1 - 1.0 K/uL   Eosinophils Relative 3 %   Eosinophils Absolute 0.2 0.0 - 0.7 K/uL   Basophils Relative 0 %   Basophils Absolute 0.0 0.0 - 0.1 K/uL  Comprehensive metabolic panel     Status: Abnormal   Collection Time: 03/10/16  5:45 AM  Result Value Ref Range   Sodium 136 135 - 145 mmol/L   Potassium 3.9 3.5 - 5.1 mmol/L   Chloride 101 101 - 111 mmol/L   CO2 26 22 - 32 mmol/L   Glucose, Bld 104 (H) 65 - 99 mg/dL   BUN 9 6 - 20 mg/dL   Creatinine, Ser 0.66 0.61 - 1.24 mg/dL   Calcium 9.6 8.9 - 10.3 mg/dL   Total Protein 6.8 6.5 - 8.1 g/dL   Albumin 3.4 (L) 3.5 - 5.0 g/dL   AST 16 15 - 41 U/L   ALT 20 17 - 63 U/L   Alkaline Phosphatase 69 38 - 126 U/L   Total Bilirubin 0.6 0.3 - 1.2 mg/dL   GFR calc non Af Amer >60 >60 mL/min   GFR calc Af Amer >60 >60 mL/min    Comment: (NOTE) The eGFR has been calculated using the CKD EPI equation. This calculation has not been validated in all clinical situations. eGFR's persistently <60 mL/min signify possible Chronic Kidney Disease.    Anion gap 9 5 - 15     HEENT: normal  Cardio: RRR and No murmur Resp: CTA B/L and Unlabored GI: BS positive and Nontender, nondistended Extremity:  Edema Mild dorsal hand Skin:   Intact Neuro: Alert/Oriented, Abnormal Sensory Reduced bilateral C6 C7 and Abnormal Motor 2 minus bilateral deltoid, biceps, triceps, finger flexors, extensors 3 minus at the hip flexor, knee extensor, ankle dorsal flexor Musc/Skel:  Neck tender and Other Cervical orthosis Gen. no acute distress   Assessment/Plan: 1. Functional deficits secondary to central cord syndrome with quadriparesis which require 3+ hours per day of interdisciplinary therapy in a comprehensive inpatient rehab setting. Physiatrist is providing close team supervision and 24 hour management of active medical problems listed below. Physiatrist and rehab team continue to assess barriers to discharge/monitor patient progress toward functional and medical  goals. FIM: Function - Bathing Body parts bathed by patient: Right arm, Left arm, Chest, Abdomen, Front perineal area, Right upper leg, Left upper leg, Right lower leg, Left lower leg Body parts bathed by helper: Buttocks, Back (assist for thoroughness) Assist Level: Touching or steadying assistance(Pt > 75%)  Function- Upper Body Dressing/Undressing What is the patient wearing?: Pull over shirt/dress Pull over shirt/dress - Perfomed by patient: Thread/unthread right sleeve, Thread/unthread left sleeve, Put head through opening, Pull shirt over trunk Assist Level: Supervision or verbal cues, Set up Set up : To obtain clothing/put away Function - Lower Body Dressing/Undressing What is the patient wearing?: Pants, Socks Position: Wheelchair/chair at sink Pants- Performed by patient: Thread/unthread right pants leg, Thread/unthread left pants leg, Pull pants up/down, Fasten/unfasten pants Socks - Performed by helper: Don/doff right sock, Don/doff left sock Assist for footwear: Maximal assist Assist for lower body dressing: Touching or steadying assistance (Pt > 75%) (min assist standing balance)  Function - Toileting Toileting steps completed by patient: Adjust clothing prior to toileting, Performs perineal hygiene, Adjust clothing after toileting Toileting Assistive Devices: Grab bar or rail Assist level: Touching or steadying assistance (Pt.75%)  Function - Air cabin crew transfer assistive device: Grab bar Assist level to toilet: Touching or steadying assistance (Pt > 75%) Assist level from toilet: Touching or steadying assistance (Pt > 75%)  Function - Chair/bed transfer Chair/bed transfer method: Stand pivot Chair/bed transfer assist level: Touching or steadying assistance (Pt > 75%)  Function - Locomotion: Wheelchair Type: Manual Max wheelchair distance: 162f Assist Level: Supervision or verbal cues Assist Level: Supervision or verbal cues Assist Level: Supervision  or verbal cues Turns around,maneuvers to table,bed, and toilet,negotiates 3% grade,maneuvers on rugs and over doorsills: No Function - Locomotion: Ambulation Assistive device: No device Max distance: 250 Assist level: Touching or steadying assistance (Pt > 75%) Assist level: Touching or steadying assistance (Pt > 75%) Assist level: Touching or steadying assistance (Pt > 75%) Assist level: Touching or steadying assistance (Pt > 75%)  Function - Comprehension Comprehension: Auditory Comprehension assist level: Follows complex conversation/direction with extra time/assistive device  Function - Expression Expression: Verbal Expression assist level: Expresses complex ideas: With extra time/assistive device  Function - Social Interaction Social Interaction assist level: Interacts appropriately 90% of the time - Needs monitoring or encouragement for participation or interaction.  Function - Problem Solving Problem solving assist level: Solves basic 90% of the time/requires cueing < 10% of the time  Function - Memory Memory assist level: Recognizes or recalls 75 - 89% of the time/requires cueing 10 - 24% of the time Patient normally able to recall (first 3 days only): Current season, Location of own room, Staff names and faces, That he or she is in a hospital  Medical Problem List and Plan: 1.  Upper extremity weakness and functional/mobility deficits secondary to central cord syndrome (pt st/p decompression and ACDF C4-7) Cont CIR PT, OT, SLP 2.  DVT Prophylaxis/Anticoagulation: Mechanical: Sequential compression devices, below knee Bilateral lower extremities. Increase mobility 3. Pain Management: Reports ongoing dysesthesias                        -titrate gabapentin up to 387m TID 4. Mood: LCSW to follow for evaluation and support.  5. Neuropsych: This patient is capable of making decisions on his own behalf. 6. Skin/Wound Care: Monitor wound daily for healing/signs of infection.  7.  Fluids/Electrolytes/Nutrition: Monitor I/O. Check lytes in am.   8.  Dysphagia: Will modify diet to dysphagia 2, nectar, appreciate ST evaluation                 9. Constipation: Augment bowel program--will add miralax bid as Senna S ineffective.No BM recorded since admit add dulc supp daily    LOS (Days) 2 A FACE TO FACE EVALUATION WAS PERFORMED  Tonio Seider E 03/11/2016, 10:23 AM

## 2016-03-12 ENCOUNTER — Inpatient Hospital Stay (HOSPITAL_COMMUNITY): Payer: BLUE CROSS/BLUE SHIELD | Admitting: Occupational Therapy

## 2016-03-12 ENCOUNTER — Inpatient Hospital Stay (HOSPITAL_COMMUNITY): Payer: BLUE CROSS/BLUE SHIELD | Admitting: Physical Therapy

## 2016-03-12 ENCOUNTER — Inpatient Hospital Stay (HOSPITAL_COMMUNITY): Payer: BLUE CROSS/BLUE SHIELD | Admitting: Speech Pathology

## 2016-03-12 MED ORDER — STARCH (THICKENING) PO POWD: ORAL | Status: DC | PRN

## 2016-03-12 MED ORDER — SALINE SPRAY 0.65 % NA SOLN
1.0000 | NASAL | Status: DC | PRN
  Filled 2016-03-12: qty 44

## 2016-03-12 MED ORDER — RESOURCE THICKENUP CLEAR PO POWD
ORAL | Status: DC | PRN
  Filled 2016-03-12: qty 125

## 2016-03-12 MED ORDER — GABAPENTIN 400 MG PO CAPS
400.0000 mg | ORAL_CAPSULE | Freq: Two times a day (BID) | ORAL | Status: DC
  Administered 2016-03-12 – 2016-03-13 (×3): 400 mg via ORAL
  Filled 2016-03-12 (×3): qty 1

## 2016-03-12 NOTE — Progress Notes (Signed)
Speech Language Pathology Daily Session Note  Patient Details  Name: Brian Petty MRN: [id number] Date of Birth: 1965/09/15  Today's Date: 03/12/2016 SLP Individual Time: 1100-1200 SLP Individual Time Calculation (min): 60 min   Short Term Goals: Week 1: SLP Short Term Goal 1 (Week 1): Pt complete complex level reasoning tasks at mod I. SLP Short Term Goal 2 (Week 1): Pt demonstrate recall of novel information at min A with use of compensatory strategies.  SLP Short Term Goal 3 (Week 1): Pt demonstrate ability to manage medications at mod I.  SLP Short Term Goal 4 (Week 1): Pt to tolerate trials of thin liquids without s/s aspiration in 3/3 trials to demonstrate readiness for upgrade vs MBSS. SLP Short Term Goal 5 (Week 1): Pt to tolerate upgraded diet textures with use of compensatory strategies at mod I.   Skilled Therapeutic Interventions: Skilled treatment session focused on dysphagia and cognitive goals. Patient consumed trials of thin liquids via straw without overt s/s of aspiration but utilized multiple swallows. Recommend patient initiate the water protocol prior to upgrade. Patient also consumed his lunch meal of Dys. 2 textures with nectar-thick liquids. Patient demonstrated intermittent overt s/s of aspiration, suspect due to talking with bolus in oral cavity. Patient participated in a functional conversation that focused on anticipatory awareness and required Min A verbal cues to identify potential activities he will need assistance with once at home.  Patient left upright in wheelchair with all needs within reach. Continue with current plan of care.   Function:  Eating Eating   Modified Consistency Diet: Yes Eating Assist Level: Set up assist for;Supervision or verbal cues   Eating Set Up Assist For: Cutting food       Cognition Comprehension Comprehension assist level: Follows complex conversation/direction with extra time/assistive device  Expression   Expression  assist level: Expresses complex ideas: With extra time/assistive device  Social Interaction Social Interaction assist level: Interacts appropriately 90% of the time - Needs monitoring or encouragement for participation or interaction.  Problem Solving Problem solving assist level: Solves basic 90% of the time/requires cueing < 10% of the time  Memory Memory assist level: Recognizes or recalls 90% of the time/requires cueing < 10% of the time    Pain No/Denies Pain   Therapy/Group: Individual Therapy  Brian Petty 03/12/2016, 3:18 PM

## 2016-03-12 NOTE — Progress Notes (Signed)
Physical Therapy Session Note  Patient Details  Name: Brian Petty MRN: [id number] Date of Birth: July 09, 1966  Today's Date: 03/12/2016 PT Individual Time: 1530-1555 PT Individual Time Calculation (min): 25 min    Short Term Goals: Week 1:  PT Short Term Goal 1 (Week 1): STG=LTG due ELOS   Skilled Therapeutic Interventions/Progress Updates:    Pt received resting in w/c and agreeable to therapy session.  Gait training throughout unit with RW and close to distant supervision with occasional verbal cues for increased BOS.  PT instructed pt in car transfer and simulated SUV height with supervision and verbal cues for sequencing.  Nustep x8 minutes at level 4 for activity tolerance and reciprocal stepping pattern NMR with forced use of UEs.  Pt returned to room at end of session and positioned in supine, call bell in reach and needs met.   Therapy Documentation Precautions:  Precautions Precautions: Cervical, Fall Required Braces or Orthoses: Cervical Brace Cervical Brace: Hard collar Restrictions Weight Bearing Restrictions: No   See Function Navigator for Current Functional Status.   Therapy/Group: Individual Therapy  [name] 03/12/2016, 4:08 PM

## 2016-03-12 NOTE — Progress Notes (Signed)
50 y.o.malewho was admitted on 03/04/16 after fall off bar stool and was unresponsive at scene. He was responsive to stimuli on evaluation by EMS and had reports of sharp burning pain BUE, bilateral hand weakness and unsteady gait.  ETOH level 119. MRI spine with edema C6 with suspicion for fracture, cord contusion C5/6 with question of ligamentous injury and multilevel spondylosis most severe at C6-7. He was evaluated by Dr. 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 He underwent decompression and  ACDF C4-7on 03/06/16 and post op has had improvement in BUE strength and decrease in neuropathy.  He did report problems with swallowing and difficulty with liquids  Subjective/Complaints: Still with dry nose. Feels that he has blood in nostrils. Hands/arms still painful. Wants to upgrade diet/fluids.  Review of systems denies chest pain, shortness of breath, nausea, vomiting, diarrhea , positive constipation  Objective: Vital Signs: Blood pressure (!) 149/93, pulse 74, temperature 98.6 F (37 C), temperature source Oral, resp. rate 16, height _0  (1.803 m), weight 79.4 kg (175 lb), SpO2 100 %. No results found. Results for orders placed or performed during the hospital encounter of 03/09/16 (from the past 72 hour(s))  CBC WITH DIFFERENTIAL     Status: None   Collection Time: 03/10/16  5:45 AM  Result Value Ref Range   WBC 5.9 4.0 - 10.5 K/uL   RBC 4.65 4.22 - 5.81 MIL/uL   Hemoglobin 13.8 13.0 - 17.0 g/dL   HCT 41.5 39.0 - 52.0 %   MCV 89.2 78.0 - 100.0 fL   MCH 29.7 26.0 - 34.0 pg   MCHC 33.3 30.0 - 36.0 g/dL   RDW 12.6 11.5 - 15.5 %   Platelets 229 150 - 400 K/uL   Neutrophils Relative % 63 %   Neutro Abs 3.7 1.7 - 7.7 K/uL   Lymphocytes Relative 25 %   Lymphs Abs 1.5 0.7 - 4.0 K/uL   Monocytes Relative 9 %   Monocytes Absolute 0.5 0.1 - 1.0 K/uL   Eosinophils Relative 3 %   Eosinophils  Absolute 0.2 0.0 - 0.7 K/uL   Basophils Relative 0 %   Basophils Absolute 0.0 0.0 - 0.1 K/uL  Comprehensive metabolic panel     Status: Abnormal   Collection Time: 03/10/16  5:45 AM  Result Value Ref Range   Sodium 136 135 - 145 mmol/L   Potassium 3.9 3.5 - 5.1 mmol/L   Chloride 101 101 - 111 mmol/L   CO2 26 22 - 32 mmol/L   Glucose, Bld 104 (H) 65 - 99 mg/dL   BUN 9 6 - 20 mg/dL   Creatinine, Ser 0.66 0.61 - 1.24 mg/dL   Calcium 9.6 8.9 - 10.3 mg/dL   Total Protein 6.8 6.5 - 8.1 g/dL   Albumin 3.4 (L) 3.5 - 5.0 g/dL   AST 16 15 - 41 U/L   ALT 20 17 - 63 U/L   Alkaline Phosphatase 69 38 - 126 U/L   Total Bilirubin 0.6 0.3 - 1.2 mg/dL   GFR calc non Af Amer >60 >60 mL/min   GFR calc Af Amer >60 >60 mL/min    Comment: (NOTE) The eGFR has been calculated using the CKD EPI equation. This calculation has not been validated in all clinical situations. eGFR's persistently <60 mL/min signify possible Chronic Kidney Disease.    Anion gap 9 5 - 15     HEENT: normal Cardio: RRR and No murmur Resp: CTA  B/L and Unlabored GI: BS positive and Nontender, nondistended Extremity:  Edema Mild dorsal hand Skin:   Intact Neuro: Alert/Oriented, Abnormal Sensory Reduced bilateral C6 C7 and Abnormal Motor 3+ to 4/5 bilateral deltoid, biceps, triceps,3- to 3/5 finger flexors, extensors and HI left greater than right. LE: 3/5   at the hip flexor, knee extensor, ankle dorsal flexors Musc/Skel:  Neck tender and Other Cervical orthosis Gen. no acute distress   Assessment/Plan: 1. Functional deficits secondary to central cord syndrome with quadriparesis which require 3+ hours per day of interdisciplinary therapy in a comprehensive inpatient rehab setting. Physiatrist is providing close team supervision and 24 hour management of active medical problems listed below. Physiatrist and rehab team continue to assess barriers to discharge/monitor patient progress toward functional and medical  goals. FIM: Function - Bathing Position: Wheelchair/chair at sink Body parts bathed by patient: Abdomen, Front perineal area, Buttocks (patient did quick wash up at sink) Body parts bathed by helper: Buttocks, Back (assist for thoroughness) Bathing not applicable: Right arm, Left arm, Chest, Right upper leg, Left upper leg, Right lower leg, Left lower leg, Back Assist Level: Supervision or verbal cues, More than reasonable time  Function- Upper Body Dressing/Undressing Upper body dressing/undressing activity did not occur: Refused What is the patient wearing?: Pull over shirt/dress Pull over shirt/dress - Perfomed by patient: Thread/unthread right sleeve, Thread/unthread left sleeve, Put head through opening, Pull shirt over trunk Assist Level: Supervision or verbal cues, Set up Set up : To obtain clothing/put away Function - Lower Body Dressing/Undressing Lower body dressing/undressing activity did not occur: Refused What is the patient wearing?: Pants, Socks Position: Wheelchair/chair at sink Pants- Performed by patient: Thread/unthread right pants leg, Thread/unthread left pants leg, Pull pants up/down, Fasten/unfasten pants Socks - Performed by helper: Don/doff right sock, Don/doff left sock Assist for footwear: Maximal assist Assist for lower body dressing: Touching or steadying assistance (Pt > 75%) (min assist standing balance)  Function - Toileting Toileting steps completed by patient: Adjust clothing prior to toileting, Performs perineal hygiene, Adjust clothing after toileting Toileting Assistive Devices: Grab bar or rail Assist level: Supervision or verbal cues  Function Midwife transfer assistive device: Grab bar Assist level to toilet: Supervision or verbal cues Assist level from toilet: Supervision or verbal cues  Function - Chair/bed transfer Chair/bed transfer method: Ambulatory Chair/bed transfer assist level: Supervision or verbal  cues Chair/bed transfer assistive device: Printmaker - Locomotion: Wheelchair Type: Manual Max wheelchair distance: 136f Assist Level: Supervision or verbal cues Assist Level: Supervision or verbal cues Assist Level: Supervision or verbal cues Turns around,maneuvers to table,bed, and toilet,negotiates 3% grade,maneuvers on rugs and over doorsills: No Function - Locomotion: Ambulation Assistive device: Walker-rolling Max distance: 200 ft Assist level: Touching or steadying assistance (Pt > 75%) Assist level: Touching or steadying assistance (Pt > 75%) Assist level: Touching or steadying assistance (Pt > 75%) Assist level: Touching or steadying assistance (Pt > 75%) Assist level: Touching or steadying assistance (Pt > 75%)  Function - Comprehension Comprehension: Auditory Comprehension assist level: Follows complex conversation/direction with extra time/assistive device  Function - Expression Expression: Verbal Expression assist level: Expresses complex ideas: With extra time/assistive device  Function - Social Interaction Social Interaction assist level: Interacts appropriately 90% of the time - Needs monitoring or encouragement for participation or interaction.  Function - Problem Solving Problem solving assist level: Solves basic 90% of the time/requires cueing < 10% of the time  Function - Memory Memory assist level: Recognizes or  recalls 75 - 89% of the time/requires cueing 10 - 24% of the time Patient normally able to recall (first 3 days only): Current season, Location of own room, Staff names and faces, That he or she is in a hospital  Medical Problem List and Plan: 1.  Upper extremity weakness and functional/mobility deficits secondary to central cord syndrome (pt st/p decompression and ACDF C4-7). Pt wanted to clarify that he DID NOT FALL OFF A BARSTOOL and that he fell while he was walking. He had not been drinking, etc.    - Cont CIR PT, OT, SLP  -motor  recovery in hands is exciting!  2.  DVT Prophylaxis/Anticoagulation: Mechanical: Sequential compression devices, below knee Bilateral lower extremities. Increase mobility 3. Pain Management: Reports ongoing dysesthesias                        -titrate gabapentin up to 423m TID 4. Mood: LCSW to follow for evaluation and support.  5. Neuropsych: This patient is capable of making decisions on his own behalf. 6. Skin/Wound Care: Monitor wound daily for healing/signs of infection.  7. Fluids/Electrolytes/Nutrition: Monitor I/O. .Marland Kitchen  8.  Dysphagia: d2,nectar. Upgrade per SLP 9. Constipation: Augment bowel program--addedmiralax bid as Senna S ineffective.  -+results with dulc supp 10. Nose--ocean nasal spray   LOS (Days) 3 A FACE TO FACE EVALUATION WAS PERFORMED  Kathlynn Swofford T 03/12/2016, 8:40 AM

## 2016-03-12 NOTE — IPOC Note (Addendum)
Overall Plan of Care [facility](IPOC) Patient Details Name: Brian Petty MRN: [id number] DOB: 1965-08-07  Admitting Diagnosis: Central Cord Syndrome  Hospital Problems: Principal Problem:   Central cord syndrome [facility](HCC) Active Problems:   Spinal cord injury, C1-C7 (HCC)   Benign essential HTN   Post-operative pain     Functional Problem List: Nursing Bowel, Pain, Sensory, Safety  PT Balance, Motor  OT Balance, Cognition, Endurance, Sensory, Safety, Motor, Pain  SLP Cognition  TR         Basic ADL's: OT Grooming, Bathing, Dressing, Toileting     Advanced  ADL's: OT Simple Meal Preparation     Transfers: PT Bed Mobility, Bed to Chair, Car, State Street CorporationFurniture, Civil Service fast [place]  OT Toilet, Research scientist (life sciences)Tub/Shower     Locomotion: PT Ambulation, Psychologist, prison and probation servicesWheelchair Mobility, Stairs     Additional Impairments: OT Fuctional Use of Upper Extremity  SLP Social Cognition   Problem Solving, Memory, Awareness  TR      Anticipated Outcomes Item Anticipated Outcome  Self Feeding Mod I  Swallowing  mod I for least restricitive diet   Basic self-care   Mod I   Toileting  Mod I   Bathroom Transfers Mod I  Bowel/Bladder  BM every day/ every other day with scheduled and prn laxatives.  Transfers   Mod I with LRAD   Locomotion  Mod I with LRAD   Communication     Cognition  mod I  Pain  Managed at goal 2/10  Safety/Judgment  Increased safety awareness; follows back precautions.   Therapy Plan: PT Intensity: Minimum of 1-2 x/day ,45 to 90 minutes PT Frequency: 5 out of 7 days PT Duration Estimated Length of Stay: 7-10 days  OT Intensity: Minimum of 1-2 x/day, 45 to 90 minutes OT Frequency: 5 out of 7 days OT Duration/Estimated Length of Stay: 7-9 days SLP Intensity: Minumum of 1-2 x/day, 30 to 90 minutes SLP Frequency: 3 to 5 out of 7 days SLP Duration/Estimated Length of Stay: 10-12 days       Team Interventions: Nursing Interventions Patient/Family Education, Bowel Management, Pain Management, Discharge  Planning, Dysphagia/Aspiration Precaution Training, Skin Care/Wound Management, Psychosocial Support  PT interventions Balance/vestibular training, Cognitive remediation/compensation, Discharge planning, Disease management/prevention, Community reintegration, Ambulation/gait training, DME/adaptive equipment instruction, Functional electrical stimulation, Functional mobility training, Neuromuscular re-education, Pain management, Patient/family education, Psychosocial support, Skin care/wound management, Splinting/orthotics, Stair training, Therapeutic Activities, Therapeutic Exercise, UE/LE Strength taining/ROM, UE/LE Coordination activities, Visual/perceptual remediation/compensation, Wheelchair propulsion/positioning  OT Interventions Warden/rangerBalance/vestibular training, Cognitive remediation/compensation, Community reintegration, Discharge planning, Neuromuscular re-education, Functional electrical stimulation, Functional mobility training, Patient/family education, Self Care/advanced ADL retraining, Visual/perceptual remediation/compensation, UE/LE Coordination activities, UE/LE Strength taining/ROM, Therapeutic Exercise, Therapeutic Activities, Splinting/orthotics  SLP Interventions Cognitive remediation/compensation, Dysphagia/aspiration precaution training, Cueing hierarchy, Medication managment, Therapeutic Activities, Patient/family education, Functional tasks  TR Interventions    SW/CM Interventions Discharge Planning, Psychosocial Support, Patient/Family Education    Team Discharge Planning: Destination: PT-Home ,OT- Home , SLP-Home Projected Follow-up: PT-Outpatient PT, Home health PT, OT-  Other (comment) (TBD), SLP-Outpatient SLP Projected Equipment Needs: PT-To be determined, OT- To be determined, SLP-None recommended by SLP Equipment Details: PT- , OT-  Patient/family involved in discharge planning: PT- Patient,  OT-Patient, SLP-Patient  MD ELOS: 7-9 days Medical Rehab Prognosis:   Excellent Assessment: 50 y.o.malewho was admitted on 03/04/16 after fall off bar stool and was unresponsive at scene. He was responsive to stimuli on evaluation by EMS and had reports of sharp burning pain BUE, bilateral hand weakness and unsteady gait. ETOH level 119. MRI spine with  edema C6 with suspicion for fracture, cord contusion C5/6 with question of ligamentous injury and multilevel spondylosis most severe at C6-7. He was evaluated by Dr. 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 He underwent decompression and ACDF C4-7on 03/06/16 and post op has had improvement in BUE strength and decrease in neuropathy. He did report problems with swallowing and difficulty with liquids    Now requiring 24/7 Rehab RN,MD, as well as CIR level PT, OT and SLP.  Treatment team will focus on ADLs and mobility with goals set at Mod I  See Team Conference Notes for weekly updates to the plan of care

## 2016-03-12 NOTE — Progress Notes (Signed)
Physical Therapy Session Note  Patient Details  Name: Brian Petty MRN: [id number] Date of Birth: September 02, 1965  Today's Date: 03/12/2016 PT Individual Time: 1000-1100 PT Individual Time Calculation (min): 60 min    Short Term Goals: Week 1:  PT Short Term Goal 1 (Week 1): STG=LTG due ELOS   Skilled Therapeutic Interventions/Progress Updates:   Pt received seated in w/c at sink with handoff from OT; denies pain and agreeable to treatment. Gait to gym with RW and S; when returning to room at end of session pt ambulated without AD and S. Instructed pt in [place] exercises in parallel bars, mod cues for technique. Rocker board oriented medial/lateral and anterior/posterior with light UE support as needed to regain balance, and therapist applying external perturbations to board for balance reactions and ankle strategy. Standing Kinetron 2x3 min with no UE support for dynamic balance, strength, endurance. Educated pt on safety in room and recommendation that he have staff present in room for transfers. Remained seated in w/c at end of session, all needs in reach.   Therapy Documentation Precautions:  Precautions Precautions: Cervical, Fall Required Braces or Orthoses: Cervical Brace Cervical Brace: Hard collar Restrictions Weight Bearing Restrictions: No Pain: Pain Assessment Pain Assessment: No/denies pain Pain Score: 0-No pain   See Function Navigator for Current Functional Status.   Therapy/Group: Individual Therapy  Vista Lawmanlizabeth J Tygielski 03/12/2016, 11:00 AM

## 2016-03-12 NOTE — Progress Notes (Signed)
Patient information reviewed and entered into eRehab system by Ashleah Valtierra, RN, CRRN, PPS Coordinator.  Information including medical coding and functional independence measure will be reviewed and updated through discharge.    

## 2016-03-12 NOTE — Progress Notes (Addendum)
Occupational Therapy Session Note  Patient Details  Name: Brian BootyMichael Kitchings MRN: [id number] Date of Birth: 1966-07-18  Today's Date: 03/12/2016 OT Individual Time: 0900-1000 and 1300-1400 OT Individual Time Calculation (min): 60 min and 60 min    Short Term Goals:Week 1:  OT Short Term Goal 1 (Week 1): STGs=LTGs due to short ELOS  Skilled Therapeutic Interventions/Progress Updates:    Session One: Pt seen for OT ADL bathing/dressing session. Pt in supine upon arrival, agreeable to tx session. He ambulated throughout room using RW with supervision to gather clothing in prep for showering task. He bathed seated on shower chair with supervision, using lateral leans to complete buttock hygiene. Increased time required to don socks due to decreased grip strength, however, pt noted "this is great therapy for my hands!" Educated extensively regarding process for removing/ replacing cervical brace pads following shower. Pt voiced that friend would be able to assist with this at d/c as he lives alone.  Grooming completed seated in w/c at sink, using build up grip on razor and toothbrush for increased control.  Pt left sitting in w/c at end of session with hand off to PT.  Pt educated throughout session regarding role of OT,PT and ST, therapy goals, and d/c planning.   Session Two: Pt seen for OT session focusing on functional mobility, activity tolerance, and fine motor coordination/ strengthening.  Pt sitting up in w/c upon arrival, agreeable to tx session.  He ambulated throughout session without AD and close supervision. Pt with 1 LOB episode requiring min-mod A to regain balance. Pt with decreased awareness of balance deficits. He completed 9 hole peg test, see results below. Pt then ambulated off unit to roof top deck to observe solar eclipse per pt request. While outside, pt provided with theraputty and exercises for pincer, 3 jaw chuck, and key pinch grasps completed.  Pt returned to room at end of  session, left in supine with all needs in reach.   9 Hole Peg Test:  L: 40.9, 34.7, 37.4 AVG: 37.6 R: 67, 46.4, 39.2 AVG:50.9  Therapy Documentation Precautions:  Precautions Precautions: Cervical, Fall Required Braces or Orthoses: Cervical Brace Cervical Brace: Hard collar Restrictions Weight Bearing Restrictions: No Pain:   No/ denies pain  See Function Navigator for Current Functional Status.   Therapy/Group: Individual Therapy  Lewis, Jaceion Aday C 03/12/2016, 7:21 AM

## 2016-03-13 ENCOUNTER — Inpatient Hospital Stay (HOSPITAL_COMMUNITY): Payer: BLUE CROSS/BLUE SHIELD | Admitting: Physical Therapy

## 2016-03-13 ENCOUNTER — Inpatient Hospital Stay (HOSPITAL_COMMUNITY): Payer: BLUE CROSS/BLUE SHIELD | Admitting: Occupational Therapy

## 2016-03-13 ENCOUNTER — Inpatient Hospital Stay (HOSPITAL_COMMUNITY): Payer: BLUE CROSS/BLUE SHIELD | Admitting: Speech Pathology

## 2016-03-13 ENCOUNTER — Inpatient Hospital Stay (HOSPITAL_COMMUNITY): Payer: BLUE CROSS/BLUE SHIELD | Admitting: *Deleted

## 2016-03-13 NOTE — Progress Notes (Signed)
Physical Therapy Session Note  Patient Details  Name: Brian Petty MRN: [id number] Date of Birth: [birth year]  Today's Date: 03/13/2016 PT Individual Time: 1105-1130 PT Individual Time Calculation (min): 25 min    Short Term Goals: Week 1:  PT Short Term Goal 1 (Week 1): STG=LTG due ELOS   Skilled Therapeutic Interventions/Progress Updates:    Pt received sleeping in bed, awakes easily to voice, no c/o pain and agreeable to session.  Session focus on gait without AD x200' with close supervision fade to distant supervision, BUE fine motor NMR with theraputty, retrieving beads out of theraputty, and placing beads in medicine bottles for pincer grasp, and manipulating clothes pins for 3-chuck grasp.  Pt amb back to room at end of session and positioned in bed with call bell in reach and needs met.   Therapy Documentation Precautions:  Precautions Precautions: Cervical, Fall Required Braces or Orthoses: Cervical Brace Cervical Brace: Hard collar Restrictions Weight Bearing Restrictions: No   See Function Navigator for Current Functional Status.   Therapy/Group: Individual Therapy  [name] 03/13/2016, 11:46 AM

## 2016-03-13 NOTE — Progress Notes (Signed)
50 y.o.malewho was admitted on 03/04/16 after fall off bar stool and was unresponsive at scene. He was responsive to stimuli on evaluation by EMS and had reports of sharp burning pain BUE, bilateral hand weakness and unsteady gait.  ETOH level 119. MRI spine with edema C6 with suspicion for fracture, cord contusion C5/6 with question of ligamentous injury and multilevel spondylosis most severe at C6-7. He was evaluated by Dr. 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 He underwent decompression and  ACDF C4-7on 03/06/16 and post op has had improvement in BUE strength and decrease in neuropathy.  He did report problems with swallowing and difficulty with liquids  Subjective/Complaints: Making progress in therapy. Still anxious for hands to be more functional.   Review of systems denies chest pain, shortness of breath, nausea, vomiting, diarrhea , positive constipation  Objective: Vital Signs: Blood pressure (!) 152/103, pulse 73, temperature 98.1 F (36.7 C), temperature source Oral, resp. rate 18, height 5\' 11"  (1.803 m), weight 79.4 kg (175 lb), SpO2 100 %. No results found. No results found for this or any previous visit (from the past 72 hour(s)).   HEENT: normal Cardio: RRR and No murmur Resp: CTA B/L and Unlabored GI: BS positive and Nontender, nondistended Extremity:  Edema Mild dorsal hand Skin:   Intact Neuro: Alert/Oriented, Abnormal Sensory Reduced bilateral C6 C7 and Abnormal Motor 3+ to 4/5 bilateral deltoid, biceps, triceps,3- to 3/5 finger flexors, extensors and HI left greater than right. LE: 3/5   at the hip flexor, knee extensor, ankle dorsal flexors Musc/Skel:  Neck tender and Other Cervical orthosis Gen. no acute distress   Assessment/Plan: 1. Functional deficits secondary to central cord syndrome with quadriparesis which require 3+ hours per day of interdisciplinary therapy in  a comprehensive inpatient rehab setting. Physiatrist is providing close team supervision and 24 hour management of active medical problems listed below. Physiatrist and rehab team continue to assess barriers to discharge/monitor patient progress toward functional and medical goals. FIM: Function - Bathing Position: Shower Body parts bathed by patient: Right arm, Left lower leg, Back, Left arm, Chest, Abdomen, Front perineal area, Buttocks, Right upper leg, Left upper leg, Right lower leg Body parts bathed by helper: Buttocks, Back (assist for thoroughness) Bathing not applicable: Right arm, Left arm, Chest, Right upper leg, Left upper leg, Right lower leg, Left lower leg, Back Assist Level: Supervision or verbal cues  Function- Upper Body Dressing/Undressing Upper body dressing/undressing activity did not occur: Refused What is the patient wearing?: Pull over shirt/dress, Orthosis Pull over shirt/dress - Perfomed by patient: Thread/unthread right sleeve, Thread/unthread left sleeve, Put head through opening, Pull shirt over trunk Orthosis activity level: Performed by helper Assist Level: Set up Set up : To apply TLSO, cervical collar Function - Lower Body Dressing/Undressing Lower body dressing/undressing activity did not occur: Refused What is the patient wearing?: Pants, Socks, Underwear, Shoes Position: Sitting EOB Underwear - Performed by patient: Thread/unthread right underwear leg, Thread/unthread left underwear leg, Pull underwear up/down Pants- Performed by patient: Thread/unthread right pants leg, Thread/unthread left pants leg, Pull pants up/down, Fasten/unfasten pants Socks - Performed by patient: Don/doff right sock, Don/doff left sock Socks - Performed by helper: Don/doff right sock, Don/doff left sock Shoes - Performed by patient: Don/doff right shoe, Don/doff left shoe (Slip ons) Assist for footwear: Setup, Supervision/touching assist Assist for lower body dressing:  Supervision or verbal cues  Function - Toileting Toileting steps completed by patient: Adjust  clothing prior to toileting, Performs perineal hygiene, Adjust clothing after toileting Toileting Assistive Devices: Grab bar or rail Assist level: Supervision or verbal cues  Function - Toilet Transfers Toilet transfer assistive device: Grab bar Assist level to toilet: Supervision or verbal cues Assist level from toilet: Supervision or verbal cues  Function - Chair/bed transfer Chair/bed transfer method: Ambulatory, Stand pivot Chair/bed transfer assist level: Supervision or verbal cues Chair/bed transfer assistive device: Armrests Chair/bed transfer details: Verbal cues for precautions/safety  Function - Locomotion: Wheelchair Type: Manual Max wheelchair distance: [id number] Assist Level: Supervision or verbal cues Assist Level: Supervision or verbal cues Assist Level: Supervision or verbal cues Turns around,maneuvers to table,bed, and toilet,negotiates 3% grade,maneuvers on rugs and over doorsills: No Function - Locomotion: Ambulation Assistive device: No device Max distance: 200 Assist level: Supervision or verbal cues Assist level: Supervision or verbal cues Assist level: Supervision or verbal cues Assist level: Supervision or verbal cues Assist level: Touching or steadying assistance (Pt > 75%)  Function - Comprehension Comprehension: Auditory Comprehension assist level: Follows complex conversation/direction with extra time/assistive device  Function - Expression Expression: Verbal Expression assist level: Expresses complex ideas: With extra time/assistive device  Function - Social Interaction Social Interaction assist level: Interacts appropriately 90% of the time - Needs monitoring or encouragement for participation or interaction.  Function - Problem Solving Problem solving assist level: Solves basic 90% of the time/requires cueing < 10% of the time  Function -  Memory Memory assist level: Recognizes or recalls 90% of the time/requires cueing < 10% of the time Patient normally able to recall (first 3 days only): Current season, Location of own room, Staff names and faces, That he or she is in a hospital  Medical Problem List and Plan: 1.  Upper extremity weakness and functional/mobility deficits secondary to central cord syndrome (pt st/p decompression and ACDF C4-7). Pt wanted to clarify that he DID NOT FALL OFF A BARSTOOL and that he fell while he was walking.     - Cont CIR PT, OT, SLP  -team conf today  2.  DVT Prophylaxis/Anticoagulation: Mechanical: Sequential compression devices, below knee Bilateral lower extremities. Increase mobility 3. Pain Management: Reports ongoing dysesthesias                        -titrated gabapentin up to 400mg  TID 4. Mood: LCSW to follow for evaluation and support.  5. Neuropsych: This patient is capable of making decisions on his own behalf. 6. Skin/Wound Care: Monitor wound daily for healing/signs of infection.  7. Fluids/Electrolytes/Nutrition: Monitor I/O. Marland Kitchen.   8.  Dysphagia: d2,nectar. Upgrade per SLP 9. Constipation: Augment bowel program--addedmiralax bid as Senna S ineffective.  -+results with dulc supp 10. Nose--ocean nasal spray   LOS (Days) 4 A FACE TO FACE EVALUATION WAS PERFORMED  SWARTZ,ZACHARY T 03/13/2016, 8:35 AM

## 2016-03-13 NOTE — Progress Notes (Signed)
Physical Therapy Session Note  Patient Details  Name: Brian Petty MRN: [id number] Date of Birth: 09-Jul-1966  Today's Date: 03/13/2016 PT Individual Time: 0730-0830 PT Individual Time Calculation (min): 60 min    Short Term Goals: Week 1:  PT Short Term Goal 1 (Week 1): STG=LTG due ELOS   Skilled Therapeutic Interventions/Progress Updates:   Pt received seated in w/c eating breakfast; denies pain and agreeable to treatment. Discussed upcoming d/c with patient and educated on team conference today; pt reports "I feel good on the walking, but I can't use my hands very well", reports he would like to use his hands at 80% of normal before he feels comfortable going home. Pt with questions regarding aspen collar and when he will be able to switch to a soft collar; deferred to physician. Gait to gym no AD and min guard with occasional staggering; increased when having a conversation with people in the hallway d/t cognitive dual task. Side stepping and braiding R/L with min guard faded to S; performed for LE coordination, dynamic standing balance with carryover into stepping strategies for LOB. Floor ladder x6 with various patterns for coordination. Seated puzzle for BUE coordination performed as active rest break. Stairs 2x12 with no handrails and S. Returned to room with S and remained seated in w/c with RN present at end of session, all needs in reach.   Therapy Documentation Precautions:  Precautions Precautions: Cervical, Fall Required Braces or Orthoses: Cervical Brace Cervical Brace: Hard collar Restrictions Weight Bearing Restrictions: No Pain: Pain Assessment Pain Assessment: No/denies pain   See Function Navigator for Current Functional Status.   Therapy/Group: Individual Therapy  Vista Lawmanlizabeth J Tygielski 03/13/2016, 8:30 AM

## 2016-03-13 NOTE — Progress Notes (Signed)
Speech Language Pathology Daily Session Note  Patient Details  Name: Brian Petty MRN: [id number] Date of Birth: 1965-08-08  Today's Date: 03/13/2016 SLP Individual Time: 1300-1400 SLP Individual Time Calculation (min): 60 min   Short Term Goals: Week 1: SLP Short Term Goal 1 (Week 1): Pt complete complex level reasoning tasks at mod I. SLP Short Term Goal 2 (Week 1): Pt demonstrate recall of novel information at min A with use of compensatory strategies.  SLP Short Term Goal 3 (Week 1): Pt demonstrate ability to manage medications at mod I.  SLP Short Term Goal 4 (Week 1): Pt to tolerate trials of thin liquids without s/s aspiration in 3/3 trials to demonstrate readiness for upgrade vs MBSS. SLP Short Term Goal 5 (Week 1): Pt to tolerate upgraded diet textures with use of compensatory strategies at mod I.   Skilled Therapeutic Interventions:   Skilled treatment session focused on addressing dysphagia goals. SLP facilitated session by providing set-up assist and skilled observation of thin liquids via straw with no overt s/s of aspiration.  Thin liquid trials were also combined with Dys.2 and Dys.3 textures with no overt s/s of aspiration.  When regular texture trials were administered patient reported globus sensation, which several thin liquid sips eventually cleared.  Recommend to continue Dys.2 textures with advancement to thin liquids.  SLP also addressed cognition goals and facilitated session with a medication management task; patient was able to formulate a process for managing scheduled and as needed medications with Supervision level verbal cues.    Function:  Eating Eating   Modified Consistency Diet: No (trilas with SLP only) Eating Assist Level: Set up assist for;Supervision or verbal cues   Eating Set Up Assist For: Opening containers       Cognition Comprehension Comprehension assist level: Follows complex conversation/direction with extra time/assistive device   Expression   Expression assist level: Expresses complex ideas: With extra time/assistive device  Social Interaction Social Interaction assist level: Interacts appropriately 90% of the time - Needs monitoring or encouragement for participation or interaction.  Problem Solving Problem solving assist level: Solves basic 90% of the time/requires cueing < 10% of the time  Memory Memory assist level: Recognizes or recalls 90% of the time/requires cueing < 10% of the time    Pain Pain Assessment Pain Assessment: No/denies pain Pain Score: 0-No pain Pain Type: Acute pain Pain Location: Shoulder Pain Orientation: Left Pain Radiating Towards: bilateral hands and fingers Pain Descriptors / Indicators: Aching;Discomfort;Numbness;Tingling Pain Frequency: Intermittent Pain Onset: Gradual Patients Stated Pain Goal: 2 Pain Intervention(s): Medication (See eMAR)  Therapy/Group: Individual Therapy  Charlane FerrettiMelissa Alonda Weaber, M.A., CCC-SLP [phone]  Tawn Fitzner 03/13/2016, 4:22 PM

## 2016-03-13 NOTE — Progress Notes (Signed)
Social Work  Social Work Assessment and Plan  Patient Details  Name: Brian Petty MRN: [id number] Date of Birth: 09-Sep-1965  Today's Date: 03/13/2016  Problem List:  Patient Active Problem List   Diagnosis Date Noted  . Closed nondisplaced fracture of sixth cervical vertebra (HCC)   . Contusion of cervical cord (HCC)   . Surgery, elective   . Central cord syndrome (HCC)   . Neurogenic bowel   . Neurogenic bladder   . Benign essential HTN   . Hyponatremia   . Post-operative pain   . Spinal cord injury, C1-C7 (HCC) 03/04/2016   Past Medical History: History reviewed. No pertinent past medical history. Past Surgical History:  Past Surgical History:  Procedure Laterality Date  . ANTERIOR CERVICAL DECOMP/DISCECTOMY FUSION N/A 03/06/2016   Procedure: ANTERIOR CERVICAL DECOMPRESSION/DISCECTOMY FUSION CERVICAL FOUR- CERVICAL FIVE, CERVICAL FIVE- CERVICAL SIX, CERVICAL SIX- CERVICAL SEVEN;  Surgeon: Lisbeth RenshawNeelesh Nundkumar, MD;  Location: [facility];  Service: Neurosurgery;  Laterality: N/A;  ANTERIOR CERVICAL DECOMPRESSION/DISCECTOMY FUSION C4-C5, C5-C6, C6-C7   Social History:  reports that he has been smoking Cigarettes.  He has been smoking about 0.50 packs per day. He has never used smokeless tobacco. He reports that he drinks alcohol. He reports that he does not use drugs.  Family / Support Systems Marital Status: Single Patient Roles: Other (Comment) (works f/t;  sibling) Other Supports: friends, Brian Petty @ (C) [phone];  Pt reports that he has no family living in [place].   Anticipated Caregiver: self and possible friends Ability/Limitations of Caregiver: Friend Missy has offered her home as needed Caregiver Availability: Intermittent Family Dynamics: Pt reports that he is close with his "very large" family, however, they "are spread out all over...from [place] to [place]    Social History Preferred language: English Religion:  Cultural Background: [place] came [place]  from [place] when he was 50 yrs old. Education: college grad Read: Yes Write: Yes Employment Status: Employed Name of Employer: [employer] Length of Employment: 20 (yrs) Return to Work Plans: Pt has submitted STD forms to employer.  He fully intends to return to work when he is medically cleared to do so. Legal Hisotry/Current Legal Issues: None Guardian/Conservator: None - per MD, pt is capable of making decisions on his own behalf.   Abuse/Neglect Physical Abuse: Denies Verbal Abuse: Denies Sexual Abuse: Denies Exploitation of patient/patient's resources: Denies Self-Neglect: Denies  Emotional Status Pt's affect, behavior adn adjustment status: Pt appears very concerned about his discharge and how he will need to depend on friends for assistance with transportation.  He reports that he has always been very independent and doesn't want to have to burden his friends.  Flat affect and appears "depressed" about his situation.  He denies any s/s of true depression and notes he is "just frustrated by all of this..."  Will monitor and refer for neuropsychology as indicated. Recent Psychosocial Issues: None Pyschiatric History: None Substance Abuse History: None  Patient / Family Perceptions, Expectations & Goals Pt/Family understanding of illness & functional limitations: Pt has a very good understanding of his injuries and his current restrictions and limitations. Premorbid pt/family roles/activities: Pt was completely independent and working f/t Anticipated changes in roles/activities/participation: Pt has mod indepedent goals.  He will need to have assistance with transport and friends are very willing to provide help. Pt/family expectations/goals: Pt eager to regain full independence  [employer] Community Agencies: None Premorbid Home Care/DME Agencies: None Transportation available at discharge: friends to assist or pt  [facility]  Discharge Planning Living  Arrangements: Alone Support Systems: Friends/neighbors Type of Residence: Private residence Insurance Resources: Media plannerrivate Insurance (specify) ([place]) Financial Resources: Employment Financial Screen Referred: No Living Expenses: [employer] administrator[employer] Money Management: Patient Does the patient have any problems obtaining your medications?: No Home Management: pt Patient/Family Preliminary Plans: Pt making decision between d/c home alone or going to stay with friends Social Work Anticipated Follow Up Needs: HH/OP Expected length of stay: 7-10 days  Clinical Impression Pleasant gentleman here after a fall and with central cord syndrome.  On track to meet mod independent goals, however, may need to general assist from friends.  He admits to much frustration over his situation and need to rely on friends for assist, however, denies any significant emotional distress.  Will follow along for support and d/c planning needs.  Zoeann Mol 03/13/2016, 4:38 PM

## 2016-03-13 NOTE — Progress Notes (Signed)
Occupational Therapy Session Note  Patient Details  Name: Brian BootyMichael Jiminez MRN: [id number] Date of Birth: 1965/10/10  Today's Date: 03/13/2016 OT Individual Time: 0900-1000 and 1420-1500 OT Individual Time Calculation (min): 60 min and 40 min    Short Term Goals:Week 1:  OT Short Term Goal 1 (Week 1): STGs=LTGs due to short ELOS  Skilled Therapeutic Interventions/Progress Updates:    Session One: Pt seen for OT session focusing on fine motor coordination and strengthening.  Pt sitting up in w/c upon arrival, desiring to wait to complete bathing/dressing this afternoon. He ambulated to therapy gym without AD and supervision, demonstrating improved balance from yesterday's session. In gym, completed strengthening exercises using soft (yellow) grade theraputty. Provided with handout of exercises. Pt with decreased gross grasp and pinch strength in B UEs R>L. Pt unable to complete finger extension/ flexion against resistance of theraputty, therefore completed exercises without resistance of putty.  He then completed gross grasping strengthening exercies utilizing wet washcloth and required to squeeze water out of rag.Empnhasis on keeping wrist in neutral position as not to allow tenodesis grasp to assist. Completed inand manipulaiton exercies focusing on transition/ translation to move small items within hand. He ambulated back to room and oral care completed at mod I level as part of water protocol. Pt sipped water with no s/s of aspiration/ distress. Pt left sitting in w/c with all needs in reach, instructed to call for assist if he needs to get up, pt voiced understanding.   Session Two: Pt seen for OT ADL bathing/dressing session and for functional transfers. Pt sitting up in w/c upon arrival, agreeable to tx session. He ambulated throughout room without AD and supervision to gather clothing items in prep for showering task. VCs required for safety awareness.  He bathed seated on shower chair,  standing at grab bars to complete buttock hygiene. He dressed seated EOB with distant supervision. In ADL apartment, pt completed simulated bathtub transfer as he does at home. Completed with supervision- mod I and use of grab bars which he has at home. Pt returned to room at end of session, left in supine with all needs in reach. Pt verbalized being anxious about upcoming d/c, wanting to stay in rehab as long as possible and nervous of being home alone. Discussed OT/PT goals of mod I. Pt wanting to stay in some "rehab facility" until his brace is removed. Educated regarding CIR and time frame for cervical collar. Spoke with CSW  About pt's concerns.   Therapy Documentation Precautions:  Precautions Precautions: Cervical, Fall Required Braces or Orthoses: Cervical Brace Cervical Brace: Hard collar Restrictions Weight Bearing Restrictions: No Pain: Pain Assessment Pain Assessment: No/denies pain  See Function Navigator for Current Functional Status.   Therapy/Group: Individual Therapy  Lewis, Eshika Reckart C 03/13/2016, 7:10 AM

## 2016-03-13 NOTE — Care Management Note (Signed)
[facility]patient Rehabilitation Center Individual Statement of Services  Patient Name:  Brian Petty  Date:  03/13/2016  Welcome to the [facility]patient [facility].  Our goal is to provide you with an individualized program based on your diagnosis and situation, designed to meet your specific needs.  With this comprehensive rehabilitation program, you will be expected to participate in at least 3 hours of rehabilitation therapies Monday-Friday, with modified therapy programming on the weekends.  Your rehabilitation program will include the following services:  Physical Therapy (PT), Occupational Therapy (OT), Speech Therapy (ST), 24 hour per day rehabilitation nursing, Therapeutic Recreaction (TR), Case Management (Social Worker), Rehabilitation Medicine, Nutrition Services and Pharmacy Services  Weekly team conferences will be held on Tuesdays to discuss your progress.  Your Social Worker will talk with you frequently to get your input and to update you on team discussions.  Team conferences with you and your family in attendance may also be held.  Expected length of stay: 7 days  Overall anticipated outcome: mod independent  Depending on your progress and recovery, your program may change. Your Social Worker will coordinate services and will keep you informed of any changes. Your Social Worker's name and contact numbers are listed  below.  The following services may also be recommended but are not provided by the Inpatient [facility] Center:   Driving Evaluations  Home Health Rehabiltiation Services  Outpatient Rehabilitation Services  Vocational Rehabilitation   Arrangements will be made to provide these services after discharge if needed.  Arrangements include referral to agencies that provide these services.  Your insurance has been verified to be:  [place] Your primary doctor is:  None  Pertinent information will be shared with your doctor and your insurance  company.  Social Worker:  [place]Lucy Totiana E[place], [place] [phone] or ([phone]) [phone]   Information discussed with and copy given to patient by: Amada JupiterHOYLE, Chelcie Estorga, 03/13/2016, 4:28 PM

## 2016-03-13 NOTE — Evaluation (Signed)
Recreational Therapy Assessment and Plan  Patient Details  Name: Floyde Dingley MRN: [id number] Date of Birth: 05-14-1966 Today's Date: 03/13/2016  Rehab Potential:   Good ELOS:   7 days   Assessment Clinical Impression: Problem List:      Patient Active Problem List   Diagnosis Date Noted  . Closed nondisplaced fracture of sixth cervical vertebra ([place])   . Contusion of cervical cord ([place])   . Surgery, elective   . Central cord syndrome ([place])   . Neurogenic bowel   . Neurogenic bladder   . Benign essential HTN   . Hyponatremia   . Post-operative pain   . Spinal cord injury, C1-C7 ([place]) 03/04/2016    Past Medical History: History reviewed. No pertinent past medical history. Past Surgical History:       Past Surgical History:  Procedure Laterality Date  . ANTERIOR CERVICAL DECOMP/DISCECTOMY FUSION N/A 03/06/2016   Procedure: ANTERIOR CERVICAL DECOMPRESSION/DISCECTOMY FUSION CERVICAL FOUR- CERVICAL FIVE, CERVICAL FIVE- CERVICAL SIX, CERVICAL SIX- CERVICAL SEVEN;  Surgeon: Consuella Lose, MD;  Location: [facility];  Service: Neurosurgery;  Laterality: N/A;  ANTERIOR CERVICAL DECOMPRESSION/DISCECTOMY FUSION C4-C5, C5-C6, C6-C7    Assessment & Plan Clinical Impression: Patient is a 50 y.o.malewho was admitted on 03/04/16 after fall off bar stool and was unresponsive at scene. He was responsive to stimuli on evaluation by EMS and had reports of sharp burning pain BUE, bilateral hand weakness and unsteady gait. ETOH level 119. MRI spine with edema C6 with suspicion for fracture, cord contusion C5/6 with question of ligamentous injury and multilevel spondylosis most severe at C6-7. He was evaluated by Dr. 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 He underwent decompression and ACDF C4-7on 03/06/16 and post op has had improvement in BUE strength and decrease in neuropathy. He  did report problems with swallowing and difficulty with liquids. Patient with difficulty with ADL tasks and balance deficits. Patient transferred to CIR on 03/09/2016   Pt presents with decreased activity tolerance, decreased functional mobility, decreased balance Limiting pt's independence with leisure/community pursuits.  Plan MIn 1 time for community reintegration  Recommendations for other services: None  Discharge Criteria: Patient will be discharged from TR if patient refuses treatment 3 consecutive times without medical reason.  If treatment goals not met, if there is a change in medical status, if patient makes no progress towards goals or if patient is discharged from hospital.  The above assessment, treatment plan, treatment alternatives and goals were discussed and mutually agreed upon: by patient  [place] 03/13/2016, 1:54 PM

## 2016-03-14 ENCOUNTER — Inpatient Hospital Stay (HOSPITAL_COMMUNITY): Payer: BLUE CROSS/BLUE SHIELD | Admitting: Occupational Therapy

## 2016-03-14 ENCOUNTER — Inpatient Hospital Stay (HOSPITAL_COMMUNITY): Payer: BLUE CROSS/BLUE SHIELD | Admitting: Physical Therapy

## 2016-03-14 ENCOUNTER — Inpatient Hospital Stay (HOSPITAL_COMMUNITY): Payer: BLUE CROSS/BLUE SHIELD | Admitting: Speech Pathology

## 2016-03-14 ENCOUNTER — Encounter (HOSPITAL_COMMUNITY): Payer: BLUE CROSS/BLUE SHIELD | Admitting: *Deleted

## 2016-03-14 MED ORDER — GABAPENTIN 600 MG PO TABS
600.0000 mg | ORAL_TABLET | Freq: Three times a day (TID) | ORAL | Status: DC
  Administered 2016-03-14 – 2016-03-15 (×5): 600 mg via ORAL
  Filled 2016-03-14 (×5): qty 1

## 2016-03-14 NOTE — Progress Notes (Signed)
Physical Therapy Session Note  Patient Details  Name: Brian Petty MRN: [id number] Date of Birth: 05-23-1966  Today's Date: 03/14/2016 PT Individual Time: 1300-1330 PT Individual Time Calculation (min): 30 min  and Today's Date: 03/14/2016 PT Concurrent Time: 0930-1100 and  PT Concurrent Time Calculation (min): 90 min    Short Term Goals: Week 1:  PT Short Term Goal 1 (Week 1): STG=LTG due ELOS   Skilled Therapeutic Interventions/Progress Updates:   Tx 1: Pt participated in community reintegration/outing to [facility] at overall min assist ambulatory level using RW.  Pt ambulated ~400' using RW with Min assist and ascended/descended Zenaida Niecevan steps and curb with min assist.  Goals focused on safe community mobility, identification & negotiation of obstacles, using BUE to retrieve items from shelves at various heights.  See outing goal sheet for full details.   Tx 2:  Pt received seated in w/c, denies pain and agreeable to treatment. Gait into bathroom with S; performed clothing management and hygiene with modI. BP assessed; 166/100 and RN alerted who reports she will discuss with PA. Gait throughout hospital with S overall, min cues for attention to surroundings. Discussed rehab goals, d/c date set for Saturday and educated pt on long term rehab process requiring continued outpatient rehab and HEP as he is reaching a level that does not require skilled inpatient care on a daily basis. Pt demonstrates fine motor impairments while attempting to add sugar to coffee and apply lid. Returned to unit; handoff to OT in hallway.   Therapy Documentation Precautions:  Precautions Precautions: Cervical, Fall Required Braces or Orthoses: Cervical Brace Cervical Brace: Hard collar Restrictions Weight Bearing Restrictions: No Pain: Pain Assessment Pain Assessment: No/denies pain Pain Score: 0-No pain   See Function Navigator for Current Functional Status.   Therapy/Group: Individual  Therapy and Concurrent  Vista Lawmanlizabeth J Tygielski 03/14/2016, 12:21 PM

## 2016-03-14 NOTE — Progress Notes (Signed)
50 y.o.malewho was admitted on 03/04/16 after fall off bar stool and was unresponsive at scene. He was responsive to stimuli on evaluation by EMS and had reports of sharp burning pain BUE, bilateral hand weakness and unsteady gait.  ETOH level 119. MRI spine with edema C6 with suspicion for fracture, cord contusion C5/6 with question of ligamentous injury and multilevel spondylosis most severe at C6-7. He was evaluated by Dr. 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 He underwent decompression and  ACDF C4-7on 03/06/16 and post op has had improvement in BUE strength and decrease in neuropathy.  He did report problems with swallowing and difficulty with liquids  Subjective/Complaints: Overall pleased with progress. Still having distal arm/hand pain every 4-5 hours. Neck was sore last night. Happy to be on thin liquids now  Review of systems denies chest pain, shortness of breath, nausea, vomiting, diarrhea ,    Objective: Vital Signs: Blood pressure (!) 152/97, pulse 83, temperature 98.5 F (36.9 C), temperature source Oral, resp. rate 18, height 5\' 11"  (1.803 m), weight 73.8 kg (162 lb 11.2 oz), SpO2 100 %. No results found. No results found for this or any previous visit (from the past 72 hour(s)).   HEENT: normal Cardio: RRR and No murmur Resp: CTA B/L and Unlabored GI: BS positive and Nontender, nondistended Extremity:  Edema Mild dorsal hand Skin:   Intact Neuro: Alert/Oriented, Abnormal Sensory Reduced bilateral C6 C7 and Abnormal Motor 3+ to 4/5 bilateral deltoid, biceps, triceps,3- to 3/5 finger flexors, extensors and HI left greater than right. LE: 3/5   at the hip flexor, knee extensor, ankle dorsal flexors Musc/Skel:  Neck tender and Other Cervical orthosis Gen. no acute distress   Assessment/Plan: 1. Functional deficits secondary to central cord syndrome with quadriparesis which  require 3+ hours per day of interdisciplinary therapy in a comprehensive inpatient rehab setting. Physiatrist is providing close team supervision and 24 hour management of active medical problems listed below. Physiatrist and rehab team continue to assess barriers to discharge/monitor patient progress toward functional and medical goals. FIM: Function - Bathing Position: Shower Body parts bathed by patient: Right arm, Left lower leg, Back, Left arm, Chest, Abdomen, Front perineal area, Buttocks, Right upper leg, Left upper leg, Right lower leg Body parts bathed by helper: Buttocks, Back (assist for thoroughness) Bathing not applicable: Right arm, Left arm, Chest, Right upper leg, Left upper leg, Right lower leg, Left lower leg, Back Assist Level: Supervision or verbal cues  Function- Upper Body Dressing/Undressing Upper body dressing/undressing activity did not occur: Refused What is the patient wearing?: Pull over shirt/dress, Orthosis Pull over shirt/dress - Perfomed by patient: Thread/unthread right sleeve, Thread/unthread left sleeve, Put head through opening, Pull shirt over trunk Orthosis activity level: Performed by helper Assist Level: More than reasonable time Set up : To apply TLSO, cervical collar Function - Lower Body Dressing/Undressing Lower body dressing/undressing activity did not occur: Refused What is the patient wearing?: Pants, Socks, Underwear, Shoes Position: Sitting EOB Underwear - Performed by patient: Thread/unthread right underwear leg, Thread/unthread left underwear leg, Pull underwear up/down Pants- Performed by patient: Thread/unthread right pants leg, Thread/unthread left pants leg, Pull pants up/down, Fasten/unfasten pants Socks - Performed by patient: Don/doff right sock, Don/doff left sock Socks - Performed by helper: Don/doff right sock, Don/doff left sock Shoes - Performed by patient: Don/doff right shoe, Don/doff left shoe Assist for footwear:  Independent Assist for lower body dressing: Supervision or verbal  cues  Function - Toileting Toileting steps completed by patient: Adjust clothing prior to toileting, Performs perineal hygiene, Adjust clothing after toileting Toileting Assistive Devices: Grab bar or rail Assist level: Supervision or verbal cues  Function - Toilet Transfers Toilet transfer assistive device: Grab bar Assist level to toilet: Supervision or verbal cues Assist level from toilet: Supervision or verbal cues  Function - Chair/bed transfer Chair/bed transfer method: Ambulatory Chair/bed transfer assist level: Supervision or verbal cues Chair/bed transfer assistive device: Walker Chair/bed transfer details: Verbal cues for technique  Function - Locomotion: Wheelchair Type: Manual Max wheelchair distance: [id number] Assist Level: Supervision or verbal cues Assist Level: Supervision or verbal cues Assist Level: Supervision or verbal cues Turns around,maneuvers to table,bed, and toilet,negotiates 3% grade,maneuvers on rugs and over doorsills: No Function - Locomotion: Ambulation Assistive device: No device Max distance: 200 Assist level: Supervision or verbal cues Assist level: Supervision or verbal cues Assist level: Supervision or verbal cues Assist level: Supervision or verbal cues Assist level: Touching or steadying assistance (Pt > 75%)  Function - Comprehension Comprehension: Auditory Comprehension assist level: Follows complex conversation/direction with extra time/assistive device  Function - Expression Expression: Verbal Expression assist level: Expresses complex ideas: With extra time/assistive device  Function - Social Interaction Social Interaction assist level: Interacts appropriately with others with medication or extra time (anti-anxiety, antidepressant).  Function - Problem Solving Problem solving assist level: Solves complex problems: With extra time  Function - Memory Memory assist  level: Recognizes or recalls 90% of the time/requires cueing < 10% of the time Patient normally able to recall (first 3 days only): Current season, Location of own room, Staff names and faces, That he or she is in a hospital  Medical Problem List and Plan: 1.  Upper extremity weakness and functional/mobility deficits secondary to central cord syndrome (pt st/p decompression and ACDF C4-7). Pt wanted to clarify that he DID NOT FALL OFF A BARSTOOL and that he fell while he was walking.     - Cont CIR PT, OT, SLP  -ELOS 8.26  2.  DVT Prophylaxis/Anticoagulation: Mechanical: Sequential compression devices, below knee Bilateral lower extremities. Increase mobility 3. Pain Management: Reports ongoing dysesthesias--sx worse every 4-5 hr                       -titrate gabapentin up to 600mg  TID 4. Mood: LCSW to follow for evaluation and support.  5. Neuropsych: This patient is capable of making decisions on his own behalf. 6. Skin/Wound Care: honeycomb dressing removed.  7. Fluids/Electrolytes/Nutrition: Monitor I/O. Marland Kitchen.   8.  Dysphagia: d2, thins now 9. Constipation: Augment bowel program--addedmiralax bid as Senna S ineffective.  -+results with dulc supp 10. Nose--ocean nasal spray at bedside for dry membrane   LOS (Days) 5 A FACE TO FACE EVALUATION WAS PERFORMED  Meily Glowacki T 03/14/2016, 8:36 AM

## 2016-03-14 NOTE — Progress Notes (Signed)
Recreational Therapy Discharge Summary Patient Details  Name: Brian Petty MRN: [id number] Date of Birth: May 17, 1966 Today's Date: 03/14/2016   Session Note:  Pain:  No c/o  Pt participated in community reintegration/outing to [facility] Improvement at overall supervision level without AD.  Goals focused on activity tolerance, community ambulation on various surface types, identification & negotiation of obstacles, accessing public restrooms.  See outing goal sheet for full details  Long term goals set: 1  Long term goals met: 1  Comments on progress toward goals:  See above  Reasons for discharge: discharge from hospital  Patient/family agrees with progress made and goals achieved: Yes  [name] 03/14/2016, 11:00 AM

## 2016-03-14 NOTE — Progress Notes (Signed)
Occupational Therapy Session Note  Patient Details  Name: Brian BootyMichael Kostka MRN: [id number] Date of Birth: 07-02-1966  Today's Date: 03/14/2016 OT Individual Time: [id number] and 1330-1400 OT Individual Time Calculation (min): 55 min and 30 min    Short Term Goals:Week 1:  OT Short Term Goal 1 (Week 1): STGs=LTGs due to short ELOS  Skilled Therapeutic Interventions/Progress Updates:    Session One: Pt seen for OT session focusing on caregiver education and fine motor coordination/ strengthening. Pt sitting up in recliner upon arrival with friend/ caregiver present for caregiver training regarding cervical brace. Demonstration and teach back method used for caregiver to remove/ replace cervical brace pads while pt in supine. Caregiver demonstrated and  voiced understanding. Pt then ambulated to ADL apartment along with caregiver at supervision level. He completed simulated bathtub transfer, using grab bars to assist with descent into tub and to get back out of tub. Pt and caregiver educated regarding what to do in case of fall, including technique for getting up from fall and need to call 911 in case an injury is suspected.  In therapy gym, pt completed clothes pin tree while standing on non-compliant surface. Pt able to manipulate medium grade (red and green) clothes pins with R UE, unable to manipulate higher resistance (black and blue) with R UE, however, able to manage all resistances with L UE. Completed x3 trials at various positions along horizontal and vertical planes. Pt left with hand off to PT and RT in prep for planned community outing.   Session Two: Pt seen for OT session focusing on functional transfers and mobility. Pt received in hallway with hand off from PT. Pt voiced having gotten suction cup grab bar during community outing and wanted to trial it in therapy bath tub. Educated regarding instillation and recommendation for placement. He completed simulated bath tub transfer using bar  at supervision- mod I level.  He then completed floor transfer x2 trials with VCs for technique, pt voiced feeling comfortable with transfer and was able to recall when it will be necessary to call 911 vs attempting to get up independently.  He then ambulated throughout unit and picked up items from floor using weaker R hand with guarding assist. Pt cont to verbalize desire to stay in rehab longer, up to another week to cont to improve. Discussed pt's current level and goals of mod I, which he is on track to meet by planned d/c date. Cont to provide education regarding follow up therapy and continuum of care. Pt returned to room at end of session, left sitting in w/c with all needs in reach.     Therapy Documentation Precautions:  Precautions Precautions: Cervical, Fall Required Braces or Orthoses: Cervical Brace Cervical Brace: Hard collar Restrictions Weight Bearing Restrictions: No Pain: Pain Assessment Pain Assessment: 0-10 Pain Score:No/ denies pain  See Function Navigator for Current Functional Status.   Therapy/Group: Individual Therapy  Lewis, Carolene Gitto C 03/14/2016, 7:04 AM

## 2016-03-14 NOTE — Progress Notes (Signed)
Speech Language Pathology Daily Session Note  Patient Details  Name: Brian Petty MRN: [id number] Date of Birth: 03-09-1966  Today's Date: 03/14/2016 SLP Individual Time: 1115-1155 SLP Individual Time Calculation (min): 40 min   Short Term Goals: Week 1: SLP Short Term Goal 1 (Week 1): Pt complete complex level reasoning tasks at mod I. SLP Short Term Goal 2 (Week 1): Pt demonstrate recall of novel information at min A with use of compensatory strategies.  SLP Short Term Goal 3 (Week 1): Pt demonstrate ability to manage medications at mod I.  SLP Short Term Goal 4 (Week 1): Pt to tolerate trials of thin liquids without s/s aspiration in 3/3 trials to demonstrate readiness for upgrade vs MBSS. SLP Short Term Goal 5 (Week 1): Pt to tolerate upgraded diet textures with use of compensatory strategies at mod I.   Skilled Therapeutic Interventions: Skilled treatment session focused on dysphagia and cognitive goals. SLP facilitated session by providing skilled observation with lunch meal of Dys. 3 textures with thin liquids via straw. Patient consumed meal without overt s/s of aspiration and was Mod I for use of swallowing compensatory strategies. Recommend patient upgrade to Dys. 3 textures. Patient required supervision verbal and question cues for safety awareness with self-care tasks. Patient left upright in wheelchair with all needs within reach. Continue with current plan of care.   Function:  Eating Eating   Modified Consistency Diet: Yes Eating Assist Level: No help, No cues           Cognition Comprehension Comprehension assist level: Follows complex conversation/direction with extra time/assistive device  Expression   Expression assist level: Expresses complex ideas: With extra time/assistive device  Social Interaction Social Interaction assist level: Interacts appropriately with others with medication or extra time (anti-anxiety, antidepressant).  Problem Solving Problem solving  assist level: Solves basic 90% of the time/requires cueing < 10% of the time  Memory Memory assist level: More than reasonable amount of time    Pain No/Denies Pain   Therapy/Group: Individual Therapy  Santasia Rew 03/14/2016, 2:25 PM

## 2016-03-14 NOTE — Patient Care Conference (Signed)
Inpatient RehabilitationTeam Conference and Plan of Care Update Date: 03/13/2016   Time: 2:15 PM    Patient Name: Brian Petty      Medical Record Number: [id number]  Date of Birth: 1966/02/22 Sex: Male         Room/Bed: 4W03C/4W03C-01 Payor Info: Payor: BLUE CROSS BLUE [employer]IELD / Plan: BCBS OTHER / Product Type: *No Product type* /    Admitting Diagnosis: Central Cord Syndrome  Admit Date/Time:  03/09/2016  5:17 PM Admission Comments: No comment available   Primary Diagnosis:  Central cord syndrome (HCC) Principal Problem: Central cord syndrome [facility](HCC)  Patient Active Problem List   Diagnosis Date Noted  . Closed nondisplaced fracture of sixth cervical vertebra (HCC)   . Contusion of cervical cord (HCC)   . Surgery, elective   . Central cord syndrome (HCC)   . Neurogenic bowel   . Neurogenic bladder   . Benign essential HTN   . Hyponatremia   . Post-operative pain   . Spinal cord injury, C1-C7 (HCC) 03/04/2016    Expected Discharge Date: Expected Discharge Date: 03/17/16  Team Members Present: Physician leading conference: Dr. Faith RogueZachary Swartz Social Worker Present: Amada JupiterLucy Jody Silas, LCSW Nurse Present: Kennyth ArnoldStacey Jennings, RN PT Present: Alyson ReedyElizabeth Tygielski, PT OT Present: Johnsie CancelAmy Lewis, OT SLP Present: Feliberto Gottronourtney Payne, SLP PPS Coordinator present : Tora DuckMarie Noel, RN, CRRN     Current Status/Progress Goal Weekly Team Focus  Medical   cervical central cord. +pain, wrist and hand weakness/ showing improvement  pain control, increased functional use of hands  pain control, bowel and bladder mgt   Bowel/Bladder   Cont. bladder and bowel, LBM 8/20  To remain continent of bladder and bowel  Monitor bladder and bowel   Swallow/Nutrition/ Hydration   Dys. 2 textures with thin liquids, Intermittent supervision  Mod I with least restrictive diet  tolerance of thin liquids, trials of upgraded textyres   ADL's   (P) Supervision overall  (P) mod I overall      Mobility   S gait with RW, min guard  gait without AD, S transfers, modI bed mobility  modI overall, S community ambulation  Dynamic standing balance, LE strengthening, activity tolerance   Communication             Safety/Cognition/ Behavioral Observations  Supervision  Mod I  awarenss, problem solving, safety    Pain   (P) C/o neck pain. Vicodin given as needed  (P) Pain <3  (P) Assess and treat pain as needed during every shift   Skin   (P) Neck incision honeycomb dsg in place  (P) No skin breakdown/infection  (P) Assess skin during every shift+    Rehab Goals Patient on target to meet rehab goals: Yes *See Care Plan and progress notes for long and short-term goals.  Barriers to Discharge: pain, dexterity    Possible Resolutions to Barriers:  ongoing analgesic adjustments, continued therapy and adaptive equipment    Discharge Planning/Teaching Needs:  Plan to d/c home with intermittent assist of friends.      Team Discussion:  Good movement returning.  Improved cognition but some safety awareness issues.  ST following for swallow and hope to upgrade to reg diet by d/c.  Currently supervision with PT/OT.    Revisions to Treatment Plan:  None   Continued Need for Acute Rehabilitation Level of Care: The patient requires daily medical management by a physician with specialized training in physical medicine and rehabilitation for the following conditions: Daily direction of a multidisciplinary physical  rehabilitation program to ensure safe treatment while eliciting the highest outcome that is of practical value to the patient.: Yes Daily medical management of patient stability for increased activity during participation in an intensive rehabilitation regime.: Yes Daily analysis of laboratory values and/or radiology reports with any subsequent need for medication adjustment of medical intervention for : Post surgical problems;Neurological problems  Brian Petty 03/14/2016, 11:52 AM

## 2016-03-15 ENCOUNTER — Inpatient Hospital Stay (HOSPITAL_COMMUNITY): Payer: BLUE CROSS/BLUE SHIELD | Admitting: Physical Therapy

## 2016-03-15 ENCOUNTER — Inpatient Hospital Stay (HOSPITAL_COMMUNITY): Payer: BLUE CROSS/BLUE SHIELD | Admitting: Occupational Therapy

## 2016-03-15 ENCOUNTER — Inpatient Hospital Stay (HOSPITAL_COMMUNITY): Payer: BLUE CROSS/BLUE SHIELD | Admitting: Speech Pathology

## 2016-03-15 MED ORDER — METHOCARBAMOL 500 MG PO TABS: 500.0000 mg | ORAL_TABLET | Freq: Four times a day (QID) | ORAL | 0 refills | Status: DC | PRN

## 2016-03-15 MED ORDER — GABAPENTIN 600 MG PO TABS: 600.0000 mg | ORAL_TABLET | Freq: Three times a day (TID) | ORAL | 0 refills | Status: DC

## 2016-03-15 MED ORDER — POLYETHYLENE GLYCOL 3350 17 G PO PACK: 17.0000 g | PACK | Freq: Two times a day (BID) | ORAL | 0 refills | Status: DC

## 2016-03-15 MED ORDER — HYDROCODONE-ACETAMINOPHEN 5-325 MG PO TABS: 1.0000 | ORAL_TABLET | ORAL | 0 refills | Status: DC | PRN

## 2016-03-15 NOTE — Progress Notes (Signed)
Physical Therapy Session Note  Patient Details  Name: Brian Petty MRN: [id number] Date of Birth: [birth year]  Today's Date: 03/15/2016 PT Individual Time:  1630-1650. Calculated PT individual time: 20 min.       Short Term Goals: Week 1:  PT Short Term Goal 1 (Week 1): STG=LTG due ELOS   Skilled Therapeutic Interventions/Progress Updates:   Patient received sitting in recliner and agreeable to PT. Patient requested to get coffee from coffee shop. Gait training for 856f without cues or assistance from PT to coffee shop and back. Patient noted to have 1 near LOB while side stepping to talk to friend, but able to self  correct. Patient demonstrated improved fine motor control by opening lid to coffee, adding sugar, and stirring coffee with RUE. Patient also carried coffee back to room with R UE and occasional support from the LUE. No sign of decreased grip throughout return to room. Once back in room patient reports that he is ready to leave, but very appreciative of all therapy provided. Patient left sitting in recliner with all needs met.   Therapy Documentation Precautions:  Precautions Precautions: Cervical, Fall Required Braces or Orthoses: Cervical Brace Cervical Brace: Hard collar Restrictions Weight Bearing Restrictions: No General:   Vital Signs: Therapy Vitals Temp: 97.7 F (36.5 C) Temp Source: Oral Pulse Rate: 80 Resp: 18 Pain:   0/10   See Function Navigator for Current Functional Status.   Therapy/Group: Individual Therapy  ALorie Phenix8/24/2017, 5:58 PM

## 2016-03-15 NOTE — Progress Notes (Signed)
Occupational Therapy Session Note  Patient Details  Name: Brian Petty MRN: [id number] Date of Birth: 01/29/1966  Today's Date: 03/15/2016 OT Individual Time: [id number] and 1400-1430 OT Individual Time Calculation (min): 60 min and 30 min    Short Term Goals:Week 1:  OT Short Term Goal 1 (Week 1): STGs=LTGs due to short ELOS  Skilled Therapeutic Interventions/Progress Updates:    Session One: Pt seen for OT ADL bathing/dressing session. Pt in supine upon arrival, very excited that he now has full finger opposition in weaker R UE. Pt eager for showering task. He ambulated throughout room with supervision to gather clothing and items in prep for showering task. He bathed seated on shower chair with distant supervision- mod I.  He returned to supine for cervical brace pads to be changed and dressed seated EOB mod I.  He ambulated throughout unit to therapy gym. Completed 9 hole peg test, see results below. Pt very pleased with improvements since last time assessment administered.  He then played Connect-4 game seated on EOM. Pt required to hold multiple game pieces in hand at one time and manipulate game pieces into hand to place into game board. Pt ambulated back to room at end of session, and left with all needs in reach.  Pt made mod I in his room and educated regarding calling for assistance if needed.   Pt now very eager to d/c to friend's home and requesting d/c date be moved up. Will discuss with rest of tx team.  9 hole peg test:  R: 51.3, 55.85, 40.32 AVG: 49.26 L: 24.83, 26.70, 23.26 AVG: 24.93  Session Two: Pt seen for OT session focusing on functional ambulation in home setting. Pt in supine upon arrival, agreeable to tx session. He ambulated throughout room to pack items in prep for d/c home later today. Required min VCs for safety awareness. Pt able to bend down to floor to obtain items and lift bags without LOB. Tolerated ~30 minutes of ambulation without need for rest break,  demonstrating improved activity tolerance.  Pt left in room with hand off to SLP. Pt denied having any questions/ concerns regarding upcoming d/c. Voiced feeling ready for d/c and appreciative of services provided on CIR.   Therapy Documentation Precautions:  Precautions Precautions: Cervical, Fall Required Braces or Orthoses: Cervical Brace Cervical Brace: Hard collar Restrictions Weight Bearing Restrictions: No Pain: Pain Assessment Pain Score: 0-No pain Faces Pain Scale: No hurt  See Function Navigator for Current Functional Status.   Therapy/Group: Individual Therapy  Lewis, Abrar Bilton C 03/15/2016, 7:11 AM

## 2016-03-15 NOTE — Progress Notes (Signed)
Physical Therapy Discharge Summary  Patient Details  Name: Brian Petty MRN: [id number] Date of Birth: 1965/10/25  Today's Date: 03/15/2016 PT Individual Time: 1000-1100 PT Individual Time Calculation (min): 60 min    Patient has met 12 of 12 long term goals due to improved activity tolerance, improved balance, improved postural control, increased strength, decreased pain, ability to compensate for deficits, functional use of  right upper extremity, right lower extremity, left upper extremity and left lower extremity, improved attention, improved awareness and improved coordination.  Patient to discharge at an ambulatory level Modified Independent.  Patient does not require assist at this time, discharging at modI level, and recommend S for community mobility however no family/friends trained to provide S. Pt able to verbalize impairments and educate family/friends as needed.    Reasons goals not met: All goals met  Recommendation:  Patient will benefit from ongoing skilled PT services in outpatient setting to continue to advance safe functional mobility, address ongoing impairments in higher level balance, activity tolerance, strength, fine motor control BUE, and minimize fall risk.  Equipment: No equipment provided  Reasons for discharge: treatment goals met and discharge from hospital  Patient/family agrees with progress made and goals achieved: Yes  PT Discharge Precautions/RestrictionsPrecautions Precautions: Cervical;Fall Required Braces or Orthoses: Cervical Brace Cervical Brace: Hard collar Restrictions Weight Bearing Restrictions: No Cognition Orientation Level: Oriented X4 Sensation Sensation Light Touch: Impaired by gross assessment Light Touch Impaired Details: Impaired RUE;Impaired LUE Stereognosis: Not tested Hot/Cold: Not tested Proprioception: Appears Intact (BLE) Coordination Gross Motor Movements are Fluid and Coordinated: Yes Fine Motor Movements are Fluid  and Coordinated: No Heel Shin Test: WFL BLE.  Motor  Motor Motor - Discharge Observations: paraparesis BUE  Mobility Bed Mobility Bed Mobility: Rolling Right;Rolling Left;Supine to Sit;Sit to Supine Rolling Right: 7: Independent Rolling Left: 7: Independent Supine to Sit: 7: Independent Sit to Supine: 7: Independent Transfers Transfers: Yes Sit to Stand: 6: Modified independent (Device/Increase time) Stand to Sit: 6: Modified independent (Device/Increase time) Stand Pivot Transfers: 6: Modified independent (Device/Increase time) Locomotion  Ambulation Ambulation: Yes Ambulation/Gait Assistance: 6: Modified independent (Device/Increase time) Ambulation Distance (Feet): 500 Feet Assistive device: None Gait Gait: Yes Gait Pattern: Impaired Gait Pattern: Lateral hip instability;Narrow base of support Gait velocity: 3.3 ft/sec High Level Ambulation High Level Ambulation: Backwards walking;Direction changes;Sudden stops;Head turns Side Stepping: modI Backwards Walking: modI Direction Changes: modI Sudden Stops: modI Head Turns: modI Stairs / Additional Locomotion Stairs: Yes Stairs Assistance: 6: Modified independent (Device/Increase time) Stair Management Technique: One rail Right;Alternating pattern;Forwards Number of Stairs: 24 Height of Stairs: 6 Ramp: 6: Modified independent (Device) Curb: 6: Modified independent (Device/increase time) Wheelchair Mobility Wheelchair Mobility: No  Trunk/Postural Assessment  Cervical Assessment Cervical Assessment: Exceptions to [facility] (cervical precautions) Thoracic Assessment Thoracic Assessment: Within Functional Limits Lumbar Assessment Lumbar Assessment: Within Functional Limits Postural Control Postural Control: Within Functional Limits  Balance Balance Balance Assessed: Yes Standardized Balance Assessment Standardized Balance Assessment: Berg Balance Test;Dynamic Gait Index Berg Balance Test Sit to Stand: Able to stand  without using hands and stabilize independently Standing Unsupported: Able to stand safely 2 minutes Sitting with Back Unsupported but Feet Supported on Floor or Stool: Able to sit safely and securely 2 minutes Stand to Sit: Sits safely with minimal use of hands Transfers: Able to transfer safely, minor use of hands Standing Unsupported with Eyes Closed: Able to stand 10 seconds safely Standing Ubsupported with Feet Together: Able to place feet together independently and stand 1 minute safely From Standing,  Reach Forward with Outstretched Arm: Can reach confidently >25 cm (10") From Standing Position, Pick up Object from Floor: Able to pick up shoe, needs supervision From Standing Position, Turn to Look Behind Over each Shoulder: Looks behind from both sides and weight shifts well Turn 360 Degrees: Able to turn 360 degrees safely in 4 seconds or less Standing Unsupported, Alternately Place Feet on Step/Stool: Able to stand independently and safely and complete 8 steps in 20 seconds Standing Unsupported, One Foot in Front: Able to place foot tandem independently and hold 30 seconds Standing on One Leg: Able to lift leg independently and hold > 10 seconds Total Score: 55 Dynamic Gait Index Level Surface: Normal Change in Gait Speed: Normal Gait with Horizontal Head Turns: Normal Gait with Vertical Head Turns: Normal Gait and Pivot Turn: Normal Step Over Obstacle: Normal Step Around Obstacles: Normal Steps: Normal Total Score: 24 Static Sitting Balance Static Sitting - Level of Assistance: 7: Independent Dynamic Sitting Balance Dynamic Sitting - Level of Assistance: 7: Independent Static Standing Balance Static Standing - Balance Support: No upper extremity supported Static Standing - Level of Assistance: 6: Modified independent (Device/Increase time) Dynamic Standing Balance Dynamic Standing - Balance Support: No upper extremity supported Dynamic Standing - Level of Assistance: 6:  Modified independent (Device/Increase time) Extremity Assessment   BUE: Defer to OT assessment   RLE Assessment RLE Assessment: Within Functional Limits (4+/5 throughout) LLE Assessment LLE Assessment: Within Functional Limits (4+/5 throughout)    See Function Navigator for Current Functional Status.  Skilled Therapeutic Intervention: Pt received seated in w/c, denies pain and agreeable to treatment. Assessed all mobility as described above with modI overall. Additionally performed floor transfer, car transfer with modI. Nustep x6 min total before pt fatigued; BUE average 45 steps/min for strengthening and endurance. Educated pt on home safety, falls prevention, and home modifications; provided handout with recommendations for home modifications. Returned to room with modI; remained seated on EOB at end of session.   Benjiman Core Tygielski 03/15/2016, 10:35 AM

## 2016-03-15 NOTE — Progress Notes (Signed)
Speech Language Pathology Daily Session Note  Patient Details  Name: Brian Petty MRN: [id number] Date of Birth: 06-25-1966  Today's Date: 03/15/2016 SLP Individual Time: 1100-1130 SLP Individual Time Calculation (min): 30 min   Short Term Goals: Week 1: SLP Short Term Goal 1 (Week 1): Pt complete complex level reasoning tasks at mod I. SLP Short Term Goal 2 (Week 1): Pt demonstrate recall of novel information at min A with use of compensatory strategies.  SLP Short Term Goal 3 (Week 1): Pt demonstrate ability to manage medications at mod I.  SLP Short Term Goal 4 (Week 1): Pt to tolerate trials of thin liquids without s/s aspiration in 3/3 trials to demonstrate readiness for upgrade vs MBSS. SLP Short Term Goal 5 (Week 1): Pt to tolerate upgraded diet textures with use of compensatory strategies at mod I.   Skilled Therapeutic Interventions: Skilled treatment session focused on dysphagia goals. SLP facilitated the session by providing trials of regular textures. Pt consumed with good oral clearing and independent implementation of compensatory swallow strategies. Pt aware of increased need for lingual movement for bolus cohesion and for liquid wash to aid in complete oral clearing. Pt able to implement these strategies independently. Pt consumed thin liquids via straw without overt s/s of aspiration. Pt returned to room and left with all needs within reach. Continue current plan of care and trials of regular for possible diet upgrade.   Function:  Eating Eating   Modified Consistency Diet: No Eating Assist Level: No help, No cues           Cognition Comprehension Comprehension assist level: Follows complex conversation/direction with extra time/assistive device  Expression   Expression assist level: Expresses complex ideas: With extra time/assistive device  Social Interaction Social Interaction assist level: Interacts appropriately with others - No medications needed.  Problem  Solving Problem solving assist level: Solves complex 90% of the time/cues < 10% of the time  Memory Memory assist level: More than reasonable amount of time    Pain Pain Assessment Pain Score: 8  Pain Type: Acute pain Pain Location: Arm Pain Orientation: Right Pain Descriptors / Indicators: Aching Pain Frequency: Constant Pain Onset: On-going Pain Intervention(s): Medication (See eMAR)  Therapy/Group: Individual Therapy  Tajia Szeliga 03/15/2016, 1:07 PM

## 2016-03-15 NOTE — Progress Notes (Signed)
Occupational Therapy Discharge Summary  Patient Details  Name: Brian Petty MRN: [id number] Date of Birth: 03/03/1966   Patient has met 10 of 10 long term goals due to improved activity tolerance, improved balance, postural control, functional use of  RIGHT upper and LEFT upper extremity and improved coordination.  Patient to discharge at overall Modified Independent level.  Patient's care partner is independent to provide the necessary physical assistance at discharge.   Pt made excellent progress with OT during CIR stay. Pt cont to have deficits with fine motor coordination and strength in B UEs including gross grasp. Provided with theraputty and HEP to cont UE strengthening.   Recommendation:  Patient will benefit from ongoing skilled OT services in outpatient setting to continue to advance functional skills in the area of iADL and Vocation.  Equipment: No equipment provided  Reasons for discharge: treatment goals met and discharge from hospital  Patient/family agrees with progress made and goals achieved: Yes  OT Discharge Precautions/Restrictions  Precautions Precautions: Cervical;Fall Required Braces or Orthoses: Cervical Brace Cervical Brace: Hard collar Restrictions Weight Bearing Restrictions: No Vision/Perception  Vision- History Baseline Vision/History: Wears glasses Wears Glasses: At all times Patient Visual Report: No change from baseline  Cognition Overall Cognitive Status: Impaired/Different from baseline Arousal/Alertness: Awake/alert Orientation Level: Oriented X4 Alternating Attention: Appears intact Memory: Appears intact Awareness: Appears intact Problem Solving: Appears intact Reasoning: Impaired Safety/Judgment: Appears intact Sensation Sensation Light Touch: Impaired by gross assessment Light Touch Impaired Details: Impaired RUE;Impaired LUE Additional Comments: Reports tingling/ numbness in B hands Coordination Gross Motor Movements are Fluid  and Coordinated: Yes Fine Motor Movements are Fluid and Coordinated: No 9 Hole Peg Test: R: 51.3, 55.85, 40.32 AVG: 49.26  L: 24.83, 26.70, 23.26 AVG: 24.93 Motor  Motor Motor: Within Functional Limits Motor - Discharge Observations: paraparesis BUE  Trunk/Postural Assessment  Cervical Assessment Cervical Assessment: Exceptions to WFL (Hard collar; cervical restrictions) Thoracic Assessment Thoracic Assessment: Within Functional Limits Lumbar Assessment Lumbar Assessment: Within Functional Limits Postural Control Postural Control: Within Functional Limits  Balance Balance Balance Assessed: Yes Static Sitting Balance Static Sitting - Level of Assistance: 7: Independent Dynamic Sitting Balance Dynamic Sitting - Level of Assistance: 7: Independent Static Standing Balance Static Standing - Balance Support: No upper extremity supported Static Standing - Level of Assistance: 6: Modified independent (Device/Increase time) Dynamic Standing Balance Dynamic Standing - Balance Support: No upper extremity supported Dynamic Standing - Level of Assistance: 6: Modified independent (Device/Increase time) Extremity/Trunk Assessment RUE Assessment RUE Assessment: Exceptions to WFL RUE AROM (degrees) Overall AROM Right Upper Extremity: Within functional limits for tasks performed RUE Strength Right Shoulder Flexion: 4-/5 Right Elbow Flexion: 4/5 Right Elbow Extension: 4/5 Right Hand Gross Grasp: Impaired LUE Assessment LUE Assessment: Exceptions to WFL LUE Strength LUE Overall Strength: Deficits    See Function Navigator for Current Functional Status.  Lewis, Amy C 03/15/2016, 4:02 PM 

## 2016-03-15 NOTE — Discharge Instructions (Signed)
Inpatient Rehab Discharge Instructions  Ivin BootyMichael Riner Discharge date and time: No discharge date for patient encounter.   Activities/Precautions/ Functional Status: Activity: Cervical collar all times Diet: Soft Wound Care: keep wound clean and dry Functional status:  ___ No restrictions     ___ Walk up steps independently ___ 24/7 supervision/assistance   ___ Walk up steps with assistance ___ Intermittent supervision/assistance  ___ Bathe/dress independently ___ Walk with walker     _x__ Bathe/dress with assistance ___ Walk Independently    ___ Shower independently ___ Walk with assistance    ___ Shower with assistance ___ No alcohol     ___ Return to work/school ________   COMMUNITY REFERRALS UPON DISCHARGE:    Outpatient: PT     OT    ST                   Agency: [facility] Phone: [phone]                Appointment Date/Time:  8/29 @ 1:45 pm for PT (arrive @ 1:15pm)                                                                  8/31 @ 1:00 - 3:00 for OT and Speech      Special Instructions:    My questions have been answered and I understand these instructions. I will adhere to these goals and the provided educational materials after my discharge from the hospital.  Patient/Caregiver Signature _______________________________ Date __________  Clinician Signature _______________________________________ Date __________  Please bring this form and your medication list with you to all your follow-up doctor's appointments.

## 2016-03-15 NOTE — Progress Notes (Signed)
Pt discharged home with significant other. Discharge instructions provided by Harvel Ricksan Anguilli, PA. All questions answered. Pt verbalized understanding. Pt ambulated off unit with personal belonging.

## 2016-03-15 NOTE — Progress Notes (Signed)
50 y.o.malewho was admitted on 03/04/16 after fall off bar stool and was unresponsive at scene. He was responsive to stimuli on evaluation by EMS and had reports of sharp burning pain BUE, bilateral hand weakness and unsteady gait.  ETOH level 119. MRI spine with edema C6 with suspicion for fracture, cord contusion C5/6 with question of ligamentous injury and multilevel spondylosis most severe at C6-7. He was evaluated by Dr. 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 He underwent decompression and  ACDF C4-7on 03/06/16 and post op has had improvement in BUE strength and decrease in neuropathy.  He did report problems with swallowing and difficulty with liquids  Subjective/Complaints: Up in BR. No new complaints. Pain getting a little better. Asked if he could go home earlier  Review of systems denies chest pain, shortness of breath, nausea, vomiting, diarrhea ,    Objective: Vital Signs: Blood pressure (!) 139/96, pulse 79, temperature 98.1 F (36.7 C), temperature source Oral, resp. rate 18, height 5\' 11"  (1.803 m), weight 73.8 kg (162 lb 11.2 oz), SpO2 100 %. No results found. No results found for this or any previous visit (from the past 72 hour(s)).   HEENT: normal Cardio: RRR and No murmur Resp: CTA B/L and Unlabored GI: BS positive and Nontender, nondistended Extremity:  Edema Mild dorsal hand Skin:   Intact Neuro: Alert/Oriented, Abnormal Sensory Reduced bilateral C6 C7 and Abnormal Motor 3+ to 4/5 bilateral deltoid, biceps, triceps,  3/5 finger flexors, extensors and HI left greater than right---can grasp and touch all fingers to thumb now.. LE: 3/5   at the hip flexor, knee extensor, ankle dorsal flexors. Good standing balance Musc/Skel:  Neck tender and Other Cervical orthosis Gen. no acute distress   Assessment/Plan: 1. Functional deficits secondary to central cord syndrome with  quadriparesis which require 3+ hours per day of interdisciplinary therapy in a comprehensive inpatient rehab setting. Physiatrist is providing close team supervision and 24 hour management of active medical problems listed below. Physiatrist and rehab team continue to assess barriers to discharge/monitor patient progress toward functional and medical goals. FIM: Function - Bathing Position: Shower Body parts bathed by patient: Right arm, Left lower leg, Back, Left arm, Chest, Abdomen, Front perineal area, Buttocks, Right upper leg, Left upper leg, Right lower leg Body parts bathed by helper: Buttocks, Back (assist for thoroughness) Bathing not applicable: Right arm, Left arm, Chest, Right upper leg, Left upper leg, Right lower leg, Left lower leg, Back Assist Level: Supervision or verbal cues  Function- Upper Body Dressing/Undressing Upper body dressing/undressing activity did not occur: Refused What is the patient wearing?: Pull over shirt/dress, Orthosis Pull over shirt/dress - Perfomed by patient: Thread/unthread right sleeve, Thread/unthread left sleeve, Put head through opening, Pull shirt over trunk Orthosis activity level: Performed by helper Assist Level: More than reasonable time Set up : To apply TLSO, cervical collar Function - Lower Body Dressing/Undressing Lower body dressing/undressing activity did not occur: Refused What is the patient wearing?: Pants, Socks, Underwear, Shoes Position: Sitting EOB Underwear - Performed by patient: Thread/unthread right underwear leg, Thread/unthread left underwear leg, Pull underwear up/down Pants- Performed by patient: Thread/unthread right pants leg, Thread/unthread left pants leg, Pull pants up/down, Fasten/unfasten pants Socks - Performed by patient: Don/doff right sock, Don/doff left sock Socks - Performed by helper: Don/doff right sock, Don/doff left sock Shoes - Performed by patient: Don/doff right shoe, Don/doff left shoe Assist for  footwear: Independent Assist for lower  body dressing: Supervision or verbal cues  Function - Toileting Toileting steps completed by patient: Adjust clothing prior to toileting, Performs perineal hygiene Toileting Assistive Devices: Grab bar or rail Assist level: Supervision or verbal cues  Function - ArchivistToilet Transfers Toilet transfer assistive device: Grab bar Assist level to toilet: Supervision or verbal cues Assist level from toilet: Supervision or verbal cues  Function - Chair/bed transfer Chair/bed transfer method: Ambulatory Chair/bed transfer assist level: Supervision or verbal cues Chair/bed transfer assistive device: Walker Chair/bed transfer details: Verbal cues for precautions/safety  Function - Locomotion: Wheelchair Type: Manual Max wheelchair distance: 11150ft Assist Level: Supervision or verbal cues Assist Level: Supervision or verbal cues Assist Level: Supervision or verbal cues Turns around,maneuvers to table,bed, and toilet,negotiates 3% grade,maneuvers on rugs and over doorsills: No Function - Locomotion: Ambulation Assistive device: No device Max distance: 1000 Assist level: Supervision or verbal cues Assist level: Supervision or verbal cues Assist level: Supervision or verbal cues Assist level: Supervision or verbal cues Assist level: Touching or steadying assistance (Pt > 75%)  Function - Comprehension Comprehension: Auditory Comprehension assist level: Follows complex conversation/direction with extra time/assistive device  Function - Expression Expression: Verbal Expression assist level: Expresses complex ideas: With extra time/assistive device  Function - Social Interaction Social Interaction assist level: Interacts appropriately with others with medication or extra time (anti-anxiety, antidepressant).  Function - Problem Solving Problem solving assist level: Solves basic 90% of the time/requires cueing < 10% of the time  Function - Memory Memory  assist level: More than reasonable amount of time Patient normally able to recall (first 3 days only): Current season, Location of own room, Staff names and faces, That he or she is in a hospital  Medical Problem List and Plan: 1.  Upper extremity weakness and functional/mobility deficits secondary to central cord syndrome (pt st/p decompression and ACDF C4-7). Pt wanted to clarify that he DID NOT FALL OFF A BARSTOOL and that he fell while he was walking.     - Cont CIR PT, OT, SLP  -ELOS 8.26 (move up to tomorrow?)  2.  DVT Prophylaxis/Anticoagulation: Mechanical: Sequential compression devices, below knee Bilateral lower extremities. Increase mobility 3. Pain Management: Reports ongoing dysesthesias--sx worse every 4-5 hr                       -titrate gabapentin up to 600mg  TID 4. Mood: LCSW to follow for evaluation and support.  5. Neuropsych: This patient is capable of making decisions on his own behalf. 6. Skin/Wound Care: honeycomb dressing removed.  7. Fluids/Electrolytes/Nutrition: Monitor I/O. Marland Kitchen.   8.  Dysphagia: upgraded to D3, thins   9. Constipation: Augment bowel program-- miralax bid as Senna S ineffective.  -needs to stay aggressive with bowel regimen once home  10. Nose--ocean nasal spray at bedside for dry membrane   LOS (Days) 6 A FACE TO FACE EVALUATION WAS PERFORMED  SWARTZ,ZACHARY T 03/15/2016, 8:17 AM

## 2016-03-15 NOTE — Progress Notes (Signed)
Speech Language Pathology Session Note & Discharge Summary  Patient Details  Name: Brian Petty MRN: [id number] Date of Birth: 1966-01-22  Today's Date: 03/15/2016 SLP Individual Time: 1430-1500 SLP Individual Time Calculation (min): 30 min    Skilled Therapeutic Interventions:  Skilled treatment session focused on cognitive goals. SLP facilitated session by providing verbal education along with handouts in regards to his current swallowing function, diet recommendations, appropriate textures and swallowing compensatory strategies. He verbalized understanding of all information. SLP also facilitated session by administering the MoCA (version 8.1). Patient scored 28/30 points with a score of 26 or above considered normal. Suspect patient is at his cognitive baseline. Patient left in room with visitor present. Continue with current plan of care.    Patient has met 6 of 6 long term goals.  Patient to discharge at overall Modified Independent level.   Reasons goals not met: N/A   Clinical Impression/Discharge Summary: Patient has made excellent gains and has met 6 of 6 LTG's this admission due to improved swallowing and cognitive function. Currently, patient is consuming Dys. 3 textures with thin liquids without overt s/s of aspiration with Mod I for use of swallowing compensatory strategies. Patient is also overall Mod I to complete functional and mildly complex tasks safely in regards to problem solving, recall, awareness and attention. Patient education is complete and patient will discharge to a friend's house with intermittent supervision. Patient would benefit from f/u SLP services to maximize his swallowing function and reduce caregiver burden.   Recommendation:  Outpatient SLP  Rationale for SLP Follow Up: Maximize swallowing safety   Equipment: N/A   Reasons for discharge: Treatment goals met;Discharged from hospital   Patient/Family Agrees with Progress Made and Goals Achieved: Yes    Function:  Eating Eating   Modified Consistency Diet: No Eating Assist Level: No help, No cues           Cognition Comprehension Comprehension assist level: Follows complex conversation/direction with extra time/assistive device  Expression   Expression assist level: Expresses complex ideas: With extra time/assistive device  Social Interaction Social Interaction assist level: Interacts appropriately with others - No medications needed.  Problem Solving Problem solving assist level: Solves complex problems: With extra time  Memory Memory assist level: More than reasonable amount of time   Brian Petty 03/15/2016, 3:19 PM

## 2016-03-15 NOTE — Progress Notes (Signed)
Social Work Patient ID: Brian Petty, male   DOB: [birth year], 50 y.o.   MRN: [id number]  Alerted by MD this morning that pt requesting to d/c today if team agrees.  MD and txs are in agreement with this and recommend OP f/u.  Pt pleased and plans to d/c home at the end of his tx sessions today.    Brunetta Newingham, LCSW

## 2016-03-16 ENCOUNTER — Inpatient Hospital Stay (HOSPITAL_COMMUNITY): Payer: BLUE CROSS/BLUE SHIELD | Admitting: Physical Therapy

## 2016-03-16 NOTE — Progress Notes (Signed)
Social Work  Discharge Note  The overall goal for the admission was met for:   Discharge location: Yes - home with friends initially  Length of Stay: Yes - 6 days  Discharge activity level: Yes - modified independent  Home/community participation: Yes  Services provided included: MD, RD, PT, OT, SLP, RN, TR, Pharmacy and SW  Financial Services: Private Insurance: [employer]  Follow-up services arranged: Outpatient: PT, OT, ST via [place] and Patient/Family has no preference for HH/DME agencies  Comments (or additional information): Pt without a primary MD. Provided him with information on local practices who are taking new patients and he will set this up.  Patient/Family verbalized understanding of follow-up arrangements: Yes  Individual responsible for coordination of the follow-up plan: pt  Confirmed correct DME delivered: Imberly Troxler 03/16/2016    Znya Albino

## 2016-03-19 ENCOUNTER — Telehealth: Payer: Self-pay | Admitting: *Deleted

## 2016-03-19 NOTE — Discharge Summary (Signed)
Physician Discharge Summary  Patient ID: Brian Petty MRN: [id number] DOB/AGE: 1965/09/17 50 y.o.  Admit date: 03/09/2016 Discharge date: 03/15/2016  Discharge Diagnoses:  Principal Problem:   Central cord syndrome [facility]) Active Problems:   Spinal cord injury, C1-C7 (HCC)   Benign essential HTN   Post-operative pain   Discharged Condition: stable     Labs:  Basic Metabolic Panel: BMP Latest Ref Rng & Units 03/10/2016 03/04/2016 03/04/2016  Glucose 65 - 99 mg/dL 409(W) - 119(J)  BUN 6 - 20 mg/dL 9 - 9  Creatinine 4.78 - 1.24 mg/dL 2.95 6.21 3.08  Sodium 135 - 145 mmol/L 136 - 132(L)  Potassium 3.5 - 5.1 mmol/L 3.9 - 4.4  Chloride 101 - 111 mmol/L 101 - 100(L)  CO2 22 - 32 mmol/L 26 - 21(L)  Calcium 8.9 - 10.3 mg/dL 9.6 - 8.6(L)    CBC: CBC Latest Ref Rng & Units 03/10/2016 03/04/2016 03/04/2016  WBC 4.0 - 10.5 K/uL 5.9 6.6 6.5  Hemoglobin 13.0 - 17.0 g/dL 65.7 84.6 96.2  Hematocrit 39.0 - 52.0 % 41.5 42.6 42.6  Platelets 150 - 400 K/uL 229 203 209    CBG: No results for input(s): GLUCAP in the last 168 hours.  Brief HPI:   Brian Petty a 50 y.o.malewho was admitted on 03/04/16 after fall off bar stool and was unresponsive at scene. He was responsive to stimuli on evaluation by EMS and had reports of sharp burning pain BUE, bilateral hand weakness and unsteady gait.  ETOH level 119. MRI spine with edema C6 with suspicion for fracture, cord contusion C5/6 with question of ligamentous injury and multilevel spondylosis most severe at C6-7. He was evaluated by Dr. 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 He underwent decompression and  ACDF C4-7on 03/06/16 and post op has had improvement in BUE strength and decrease in neuropathy.  He did report problems with swallowing and difficulty with liquids. Patient with difficulty with ADL tasks and balance deficits. CIR recommended for follow up  therapy   Hospital Course: Brian Petty was admitted to rehab 03/09/2016 for inpatient therapies to consist of PT, ST and OT at least three hours five days a week. Past admission physiatrist, therapy team and rehab RN have worked together to provide customized collaborative inpatient rehab. Swallow evaluation was done due to reports of dysphagia and diet was downgraded to dysphagia 2, nectar liquids. He has has issues with constipation and bowel program has been adjusted to help with symptoms. He has been educated on need for aggressive regimen at home to help with mange OIC. Neck incision has been healing well without signs or symptoms of infection. Pain control is improving but he continued to have complaints of dysesthesias. Gabapentin has been slowly titrated upward to 600 mg tid to help with symptoms. Pain is reasonably controlled with prn use of hydrocodone and occasional use of robaxin. He has made steady progress during his stay and is modified independent at discharge.  He will continue to receive follow up outpatient PT, OT and [facility] at [facility] after discharge.    Rehab course: During patient's stay in rehab weekly team conferences were held to monitor patient's progress, set goals and discuss barriers to discharge. At admission, patient required moderate assist with high level executive and functional tasks. He demonstrated mild to moderate pharyngeal dysphagia requiring modified diet. He required min assist with mobility and basic self care tasks. He has had improvement  in activity tolerance, balance, postural control, as well as ability to compensate for deficits. He is able to ambulate 800' without AD and supervision. He is able to complete ADL tasks at modified independent level. He has been educated on HEP to help with fine motor deficits as well as strength BUE. He is tolerating dysphagia 3 diet, thin liquids without s/s of aspiration and is currently able to complete  functional and mildly complex tasks at modified independent level. MOCA score 28/30 at discharge and patient is at his cognitive baseline. Family education was done    Disposition: 01-Home or Self Care  Diet: Soft diet.   Special Instructions: 1. Wear collar at all times.    Discharge Instructions    Ambulatory referral to Physical Medicine Rehab    Complete by:  As directed   Moderate complexity follow-up 1-2 weeks central cord syndrome       Medication List    STOP taking these medications   gabapentin 300 MG capsule Commonly known as:  NEURONTIN Replaced by:  gabapentin 600 MG tablet   ibuprofen 200 MG tablet Commonly known as:  ADVIL,MOTRIN     TAKE these medications   gabapentin 600 MG tablet Commonly known as:  NEURONTIN Take 1 tablet (600 mg total) by mouth 3 (three) times daily. Replaces:  gabapentin 300 MG capsule   HYDROcodone-acetaminophen 5-325 MG tablet--Rx # 10 pills  Commonly known as:  NORCO/VICODIN Take 1-2 tablets by mouth every 4 (four) hours as needed for moderate pain.   methocarbamol 500 MG tablet Commonly known as:  ROBAXIN Take 1 tablet (500 mg total) by mouth every 6 (six) hours as needed for muscle spasms.   OVER THE COUNTER MEDICATION Place 1 drop into both eyes 3 (three) times daily as needed (dry eyes). Over the counter lubricating eye drop   polyethylene glycol packet Commonly known as:  MIRALAX / GLYCOLAX Take 17 g by mouth 2 (two) times daily.      Follow-up Information    Ranelle OysterSWARTZ,ZACHARY T, MD .   Specialty:  Physical Medicine and Rehabilitation Why:  Office to call for appointment Contact information: [address] [place] [address] [phone]        Jackelyn HoehnNUNDKUMAR, NEELESH, C, MD .   Specialty:  Neurosurgery Why:  Call for appointment 2 weeks Contact information: [address] [place] [address] [phone]           Signed: Jacquelynn CreeLove, Linette Gunderson S 03/19/2016, 3:08 PM

## 2016-03-20 ENCOUNTER — Telehealth: Payer: Self-pay | Admitting: Physical Medicine & Rehabilitation

## 2016-03-20 NOTE — Telephone Encounter (Signed)
Patient was recently discharged from hospital and has questions about his medications.  If someone could please call him at [phone].

## 2016-03-21 MED ORDER — HYDROCODONE-ACETAMINOPHEN 10-325 MG PO TABS: 1.0000 | ORAL_TABLET | Freq: Four times a day (QID) | ORAL | 0 refills | Status: DC | PRN

## 2016-03-21 NOTE — Telephone Encounter (Signed)
Pt called, advised that script was written and ready for p/u

## 2016-03-21 NOTE — Telephone Encounter (Signed)
New rx written. Also increase gabapentin to 600mg  QID if he's tolerating TID currently

## 2016-03-21 NOTE — Telephone Encounter (Signed)
Pt states he only has one hydrocodone left. He is requesting a refill or a different medication. He states that the pain is so severe, it is effecting the quality of his sleep at night. Pt does have an appt on 03/28/16 with Dr. Allena KatzPatel first and plans on attending. Please advise.

## 2016-03-22 NOTE — Telephone Encounter (Signed)
error 

## 2016-03-28 ENCOUNTER — Telehealth: Payer: Self-pay | Admitting: Physical Medicine & Rehabilitation

## 2016-03-28 ENCOUNTER — Encounter: Payer: Self-pay | Admitting: Physical Medicine & Rehabilitation

## 2016-03-28 ENCOUNTER — Encounter
Payer: BLUE CROSS/BLUE SHIELD | Attending: Physical Medicine & Rehabilitation | Admitting: Physical Medicine & Rehabilitation

## 2016-03-28 VITALS — BP 132/87 | HR 63 | Resp 16

## 2016-03-28 DIAGNOSIS — S14127D Central cord syndrome at C7 level of cervical spinal cord, subsequent encounter: Secondary | ICD-10-CM | POA: Diagnosis present

## 2016-03-28 DIAGNOSIS — R1310 Dysphagia, unspecified: Secondary | ICD-10-CM

## 2016-03-28 DIAGNOSIS — X58XXXD Exposure to other specified factors, subsequent encounter: Secondary | ICD-10-CM | POA: Diagnosis not present

## 2016-03-28 DIAGNOSIS — Z5181 Encounter for therapeutic drug level monitoring: Secondary | ICD-10-CM

## 2016-03-28 DIAGNOSIS — S14129S Central cord syndrome at unspecified level of cervical spinal cord, sequela: Secondary | ICD-10-CM | POA: Diagnosis not present

## 2016-03-28 DIAGNOSIS — M792 Neuralgia and neuritis, unspecified: Secondary | ICD-10-CM | POA: Diagnosis not present

## 2016-03-28 DIAGNOSIS — F17200 Nicotine dependence, unspecified, uncomplicated: Secondary | ICD-10-CM | POA: Diagnosis not present

## 2016-03-28 DIAGNOSIS — G8918 Other acute postprocedural pain: Secondary | ICD-10-CM

## 2016-03-28 DIAGNOSIS — Z79899 Other long term (current) drug therapy: Secondary | ICD-10-CM

## 2016-03-28 DIAGNOSIS — K592 Neurogenic bowel, not elsewhere classified: Secondary | ICD-10-CM

## 2016-03-28 MED ORDER — AMITRIPTYLINE HCL 75 MG PO TABS: 75.0000 mg | ORAL_TABLET | Freq: Every day | ORAL | 1 refills | Status: DC

## 2016-03-28 MED ORDER — AMITRIPTYLINE HCL 150 MG PO TABS: 150.0000 mg | ORAL_TABLET | Freq: Every day | ORAL | 1 refills | Status: DC

## 2016-03-28 NOTE — Telephone Encounter (Signed)
Pharmacist has question about Elavil medication.  Please call them at [phone].

## 2016-03-28 NOTE — Addendum Note (Signed)
Addended by: Angela NevinWESSLING, Kambrey Hagger D on: 03/28/2016 03:49 PM   Modules accepted: Orders

## 2016-03-28 NOTE — Progress Notes (Signed)
Subjective:    Patient ID: Brian Petty, male    DOB: 09-10-1965, 50 y.o.   MRN: [id number]  HPI  50 y.o. male who was admitted on 03/04/16 after fall resulting in central cord syndrome. Pt received CIR and presents for hospital follow up.  He was advised to wear his C-collar at all times.  Pt has questions about wearing his C-collar.  Pt has pain at night, which makes sleeping difficult.  It is mainly shooting pain in his arm with numbness tingling.  His Gabapentin was increased to 600 QID after discharge, but he states he never received this medication.  He is now eating a regular diet, without difficulty.  His constipation is under control.    DME: None needed Mobility: No assistive device needed Therapies: 2/week    Pain Inventory Average Pain 8 Pain Right Now 4 My pain is burning, stabbing and tingling  In the last 24 hours, has pain interfered with the following? General activity 8 Relation with others 8 Enjoyment of life 8 What TIME of day is your pain at its worst? NA Sleep (in general) NA  Pain is worse with: NA Pain improves with: rest and sleep Relief from Meds: 3  Mobility walk without assistance how many minutes can you walk? 15-30 ability to climb steps?  yes do you drive?  no  Function disabled: date disabled 03/04/16 I need assistance with the following:  meal prep, household duties and shopping  Neuro/Psych weakness numbness tingling loss of taste or smell  Prior Studies Any changes since last visit?  no  Physicians involved in your care Neurologist Dr. Conchita ParisNundkumar   History reviewed. No pertinent family history. Social History   Social History  . Marital status: Single    Spouse name: N/A  . Number of children: N/A  . Years of education: N/A   Social History Main Topics  . Smoking status: Current Every Day Smoker    Packs/day: 0.50    Types: Cigarettes  . Smokeless tobacco: Never Used  . Alcohol use Yes     Comment: occasional with  dinner  . Drug use: No  . Sexual activity: Not Asked   Other Topics Concern  . None   Social History Narrative  . None   Past Surgical History:  Procedure Laterality Date  . ANTERIOR CERVICAL DECOMP/DISCECTOMY FUSION N/A 03/06/2016   Procedure: ANTERIOR CERVICAL DECOMPRESSION/DISCECTOMY FUSION CERVICAL FOUR- CERVICAL FIVE, CERVICAL FIVE- CERVICAL SIX, CERVICAL SIX- CERVICAL SEVEN;  Surgeon: Lisbeth RenshawNeelesh Nundkumar, MD;  Location: [facility];  Service: Neurosurgery;  Laterality: N/A;  ANTERIOR CERVICAL DECOMPRESSION/DISCECTOMY FUSION C4-C5, C5-C6, C6-C7   History reviewed. No pertinent past medical history. BP 132/87   Pulse 63   Resp 16   SpO2 98%   Opioid Risk Score:   Fall Risk Score:  `1  Depression screen PHQ 2/9  Depression screen PHQ 2/9 03/28/2016  Decreased Interest 2  Down, Depressed, Hopeless 2  PHQ - 2 Score 4  Altered sleeping 3  Tired, decreased energy 2  Change in appetite 3  Feeling bad or failure about yourself  1  Trouble concentrating 1  Moving slowly or fidgety/restless 2  Suicidal thoughts 0  PHQ-9 Score 16  Difficult doing work/chores Very difficult    Review of Systems  Constitutional:       Loss taste or small  Neurological: Positive for weakness and numbness.       Tingling   All other systems reviewed and are negative.  Objective:   Physical Exam HEENT: normocephalic. C-collar in place.  Cardio: RRR and No murmur Resp: CTA B/L and Unlabored GI: BS positive and Nontender, nondistended Extremity:  Edema Mild dorsal hand Skin:   Warm and dry.  Neuro: Alert/Oriented,  Sensation reduced to light touch bilateral C6 C7 dermatomes Motor: 4/5 bilateral deltoid, biceps, wrist extensors, 4-/5 triceps, 3/5 APB, DI (right stronger than left)  B/l LE: 4+/5   at the hip flexor, knee extensor, ankle dorsal flexors. Good standing balance Musc/Skel:  No edema or tenderness in extremities Gen. no acute distress. Vital signs reviewed.     Assessment  & Plan:  50 y.o. male who was admitted on 03/04/16 after fall resulting in central cord syndrome.   1. Central cord syndrome (pt st/p decompression and ACDF C4-7).   Cont therapies  Pt has not received a follow up appointment with Neurosurg, encouraged to follow up.  C-collar at all times, until cleared by Neurosurg  2. Neuropathic pain/Post-operative pain   Cont meds  Will add elavil for sleep and night time pain.  Elavil 75mg , may increase to 150mg  after 2 weeks if incomplete efficacy and no side effects  3. Dysphagia  Per history, pt to be on D3 diet, however, states he is currently taking regular foods without difficulty  Educated on risks and signs/symptoms aspiration, pt states he also recently saw SLP  4.  Constipation due to Neurogenic bowel  Cont bowel program  No complaints at present  Referrals reviewed: needs Neurosurg Meds reviewed All questions answered

## 2016-04-06 LAB — TOXASSURE SELECT,+ANTIDEPR,UR

## 2016-04-11 ENCOUNTER — Telehealth: Payer: Self-pay

## 2016-04-11 NOTE — Telephone Encounter (Signed)
Thank.  Pt Dr. Riley KillSwartz pt.  Dr. Riley KillSwartz to address on next visit.

## 2016-04-11 NOTE — Telephone Encounter (Signed)
Pt's UDS had a positive level of alcohol and oxazepam. However, there are no benzodiazepines in med list. Brian MorrowAnkit Patel, MD will address at next visit.

## 2016-04-16 NOTE — Telephone Encounter (Signed)
This patient stopped by our clinic needing refills for gabapentin, robaxin, and hydrocodone. According the telephone thread, this patient was a Dr. Riley KillSwartz discharge that was seen by Dr. Allena KatzPatel initially in clinic.  His UDS came back abnormal and Dr. Allena KatzPatel advised that Dr. Riley KillSwartz can discuss abnormal UDS with patient and make treatment decisions at 04/24/2016 visit.  Meanwhile, patient is needing medications refilled. Can we fill gabapentin and robaxin now?

## 2016-04-18 NOTE — Telephone Encounter (Signed)
Will not rx these meds while he's drinking etoh. He has a choice. If he wants these meds from me, he needs to provide us a clean urine sample.

## 2016-04-19 NOTE — Telephone Encounter (Signed)
Informed patient, he acknowledged

## 2016-04-24 ENCOUNTER — Encounter: Payer: Self-pay | Admitting: Physical Medicine & Rehabilitation

## 2016-04-24 ENCOUNTER — Encounter
Payer: BLUE CROSS/BLUE SHIELD | Attending: Physical Medicine & Rehabilitation | Admitting: Physical Medicine & Rehabilitation

## 2016-04-24 VITALS — BP 123/83 | HR 73

## 2016-04-24 DIAGNOSIS — G8918 Other acute postprocedural pain: Secondary | ICD-10-CM | POA: Diagnosis not present

## 2016-04-24 DIAGNOSIS — Z5181 Encounter for therapeutic drug level monitoring: Secondary | ICD-10-CM | POA: Diagnosis not present

## 2016-04-24 DIAGNOSIS — S14129S Central cord syndrome at unspecified level of cervical spinal cord, sequela: Secondary | ICD-10-CM | POA: Diagnosis not present

## 2016-04-24 DIAGNOSIS — S14109S Unspecified injury at unspecified level of cervical spinal cord, sequela: Secondary | ICD-10-CM

## 2016-04-24 DIAGNOSIS — F17200 Nicotine dependence, unspecified, uncomplicated: Secondary | ICD-10-CM | POA: Insufficient documentation

## 2016-04-24 DIAGNOSIS — Z79899 Other long term (current) drug therapy: Secondary | ICD-10-CM | POA: Diagnosis not present

## 2016-04-24 DIAGNOSIS — M792 Neuralgia and neuritis, unspecified: Secondary | ICD-10-CM

## 2016-04-24 DIAGNOSIS — R1310 Dysphagia, unspecified: Secondary | ICD-10-CM | POA: Insufficient documentation

## 2016-04-24 DIAGNOSIS — S14127D Central cord syndrome at C7 level of cervical spinal cord, subsequent encounter: Secondary | ICD-10-CM | POA: Diagnosis not present

## 2016-04-24 NOTE — Patient Instructions (Signed)
WAYS TO AVOID DIZZINESS   1. TAKE YOUR TIME WHEN YOU STAND UP (ESPECIALLY IF YOU'VE BEEN SITTING FOR A LONG PERIOD OF TIME) 2. PLENTY OF FLUIDS 3. PLENTY OF REST 4. KNEE OR THIGH COMPRESSION STOCKINGS 5. ABDOMINAL BINDER FOR MORE SEVERE SYMPTOMS   PLEASE CALL ME WITH ANY PROBLEMS OR QUESTIONS [phone])

## 2016-04-24 NOTE — Progress Notes (Signed)
Subjective:    Patient ID: Brian Petty, male    DOB: [birth year], 50 y.o.   MRN: [id number]  HPI   Casimiro NeedleMichael is here in follow up of his his cervical central cord. He is now out of the cervical collar and NS appears pleased with his progress.  He has worked with therapy to improve balance and functionality of his upper extremities. Pain is much improved but his tingling is still present right more than left. He is off the gabapentin but the amtitripyline is helping his pain and sleep. He continues on elavil 75mg  qhs.  Bowels and bladder are functioning appropriately.   He has concerns over his pressure which has been low. He notices dizziness when he gets up too fast.    Pain Inventory Average Pain 4 Pain Right Now 2 My pain is burning  In the last 24 hours, has pain interfered with the following? General activity 4 Relation with others 4 Enjoyment of life 7 What TIME of day is your pain at its worst? night Sleep (in general) Poor  Pain is worse with: inactivity Pain improves with: activity Relief from Meds: na  Mobility walk without assistance  Function disabled: date disabled .  Neuro/Psych weakness  Prior Studies Any changes since last visit?  no  Physicians involved in your care Any changes since last visit?  no   No family history on file. Social History   Social History  . Marital status: Single    Spouse name: N/A  . Number of children: N/A  . Years of education: N/A   Social History Main Topics  . Smoking status: Current Every Day Smoker    Packs/day: 0.50    Types: Cigarettes  . Smokeless tobacco: Never Used  . Alcohol use Yes     Comment: occasional with dinner  . Drug use: No  . Sexual activity: Not on file   Other Topics Concern  . Not on file   Social History Narrative  . No narrative on file   Past Surgical History:  Procedure Laterality Date  . ANTERIOR CERVICAL DECOMP/DISCECTOMY FUSION N/A 03/06/2016   Procedure: ANTERIOR  CERVICAL DECOMPRESSION/DISCECTOMY FUSION CERVICAL FOUR- CERVICAL FIVE, CERVICAL FIVE- CERVICAL SIX, CERVICAL SIX- CERVICAL SEVEN;  Surgeon: Lisbeth RenshawNeelesh Nundkumar, MD;  Location: [facility];  Service: Neurosurgery;  Laterality: N/A;  ANTERIOR CERVICAL DECOMPRESSION/DISCECTOMY FUSION C4-C5, C5-C6, C6-C7   No past medical history on file. BP 123/83   Pulse 73   SpO2 98%   Opioid Risk Score:   Fall Risk Score:  `1  Depression screen PHQ 2/9  Depression screen [facility] 2/9 04/24/2016 03/28/2016  Decreased Interest 0 2  Down, Depressed, Hopeless 0 2  PHQ - 2 Score 0 4  Altered sleeping - 3  Tired, decreased energy - 2  Change in appetite - 3  Feeling bad or failure about yourself  - 1  Trouble concentrating - 1  Moving slowly or fidgety/restless - 2  Suicidal thoughts - 0  PHQ-9 Score - 16  Difficult doing work/chores - Very difficult    Review of Systems  Constitutional: Positive for unexpected weight change.  All other systems reviewed and are negative.      Objective:   Physical Exam  HEENT: normocephalic.  .  Cardio: RRR and No murmur Resp: CTA B/L and Unlabored GI: BS positive and Nontender, nondistended Extremity: Edema Mild dorsal hand Skin: Warm and dry.  Neuro: Alert/Oriented,  Sensation reduced to light touch bilateral C6 C7 dermatomes but almost  normal now Motor: 5/5 bilateral deltoid, biceps, wrist extensors, 5/5 triceps, 4/5 APB, DI (right stronger than left)  B/l LE: 5/5 at the hip flexor, knee extensor, ankle dorsal flexors. Good standing balance. Able to stand briefly on one leg Musc/Skel: No edema or tenderness in extremities.  Gen. no acute distress. Vital signs reviewed.     Assessment & Plan:  50 y.o.malewho was admitted on 03/04/16 after fall resulting in central cord syndrome.   1. Central cord syndrome (pt st/p decompression and ACDF C4-7).               -Cont therapies to completion.   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  2. Neuropathic pain/Post-operative pain ---much improved                        Elavil 75mg  if needed.   3. Dysphagia             Resolved  4.  Dizziness: likely multifactorial hypotension related to spinal cord injury, weight loss, medications. Provided the patient several ideas to assist with improving his bp including: sitting and letting feet dangle before standing, extra fluids, abd binder, TEDS. This should resolve on its own over time   Follow up in 2 months. Could potentially return to work at that time.

## 2016-04-25 ENCOUNTER — Telehealth: Payer: Self-pay | Admitting: Physical Medicine & Rehabilitation

## 2016-05-01 LAB — TOXASSURE SELECT,+ANTIDEPR,UR

## 2016-05-02 ENCOUNTER — Telehealth: Payer: Self-pay | Admitting: *Deleted

## 2016-05-02 NOTE — Progress Notes (Signed)
Urine drug screen for this encounter is consistent for prescribed medication 

## 2016-05-02 NOTE — Telephone Encounter (Signed)
Patient's UDS came back consistent this time (no alcohol). It was noted on the last UDS that you were not going to prescribe unless a clean UDS was given. That has been satisfied

## 2016-06-25 ENCOUNTER — Encounter: Payer: Self-pay | Admitting: Physical Medicine & Rehabilitation

## 2016-06-25 ENCOUNTER — Encounter
Payer: BLUE CROSS/BLUE SHIELD | Attending: Physical Medicine & Rehabilitation | Admitting: Physical Medicine & Rehabilitation

## 2016-06-25 VITALS — BP 150/86 | HR 64 | Resp 14

## 2016-06-25 DIAGNOSIS — S14129S Central cord syndrome at unspecified level of cervical spinal cord, sequela: Secondary | ICD-10-CM | POA: Diagnosis not present

## 2016-06-25 DIAGNOSIS — G8918 Other acute postprocedural pain: Secondary | ICD-10-CM | POA: Insufficient documentation

## 2016-06-25 DIAGNOSIS — S14109S Unspecified injury at unspecified level of cervical spinal cord, sequela: Secondary | ICD-10-CM | POA: Diagnosis not present

## 2016-06-25 DIAGNOSIS — F17200 Nicotine dependence, unspecified, uncomplicated: Secondary | ICD-10-CM | POA: Diagnosis not present

## 2016-06-25 DIAGNOSIS — S14127D Central cord syndrome at C7 level of cervical spinal cord, subsequent encounter: Secondary | ICD-10-CM | POA: Insufficient documentation

## 2016-06-25 DIAGNOSIS — R1310 Dysphagia, unspecified: Secondary | ICD-10-CM | POA: Diagnosis not present

## 2016-06-25 NOTE — Patient Instructions (Signed)
1. POSTURE, POSTURE, POSTURE!!!!!!! STANDING, SITTING, LYING, EXERCISING 2. SOFT CERVICAL COLLAR---NO MORE THAN 1 HOUR PER DAY FOR RELIEF----DON'T WANT TO CAUSE MUSCLE WEAKNESS 3. STRENGTHENING EXERCISES FOR YOUR NECK AND SHOULDER    Cervical Strain and Sprain Rehab Ask your health care provider which exercises are safe for you. Do exercises exactly as told by your health care provider and adjust them as directed. It is normal to feel mild stretching, pulling, tightness, or discomfort as you do these exercises, but you should stop right away if you feel sudden pain or your pain gets worse.Do not begin these exercises until told by your health care provider. Stretching and range of motion exercises These exercises warm up your muscles and joints and improve the movement and flexibility of your neck. These exercises also help to relieve pain, numbness, and tingling. Exercise A: Cervical side bend 1. Using good posture, sit on a stable chair or stand up. 2. Without moving your shoulders, slowly tilt your left / right ear to your shoulder until you feel a stretch in your neck muscles. You should be looking straight ahead. 3. Hold for __________ seconds. 4. Repeat with the other side of your neck. Repeat __________ times. Complete this exercise __________ times a day. Exercise B: Cervical rotation 1. Using good posture, sit on a stable chair or stand up. 2. Slowly turn your head to the side as if you are looking over your left / right shoulder.  Keep your eyes level with the ground.  Stop when you feel a stretch along the side and the back of your neck. 3. Hold for __________ seconds. 4. Repeat this by turning to your other side. Repeat __________ times. Complete this exercise __________ times a day. Exercise C: Thoracic extension and pectoral stretch 1. Roll a towel or a small blanket so it is about 4 inches (10 cm) in diameter. 2. Lie down on your back on a firm surface. 3. Put the towel  lengthwise, under your spine in the middle of your back. It should not be not under your shoulder blades. The towel should line up with your spine from your middle back to your lower back. 4. Put your hands behind your head and let your elbows fall out to your sides. 5. Hold for __________ seconds. Repeat __________ times. Complete this exercise __________ times a day. Strengthening exercises These exercises build strength and endurance in your neck. Endurance is the ability to use your muscles for a long time, even after your muscles get tired. Exercise D: Upper cervical flexion, isometric 1. Lie on your back with a thin pillow behind your head and a small rolled-up towel under your neck. 2. Gently tuck your chin toward your chest and nod your head down to look toward your feet. Do not lift your head off the pillow. 3. Hold for __________ seconds. 4. Release the tension slowly. Relax your neck muscles completely before you repeat this exercise. Repeat __________ times. Complete this exercise __________ times a day. Exercise E: Cervical extension, isometric 1. Stand about 6 inches (15 cm) away from a wall, with your back facing the wall. 2. Place a soft object, about 6-8 inches (15-20 cm) in diameter, between the back of your head and the wall. A soft object could be a small pillow, a ball, or a folded towel. 3. Gently tilt your head back and press into the soft object. Keep your jaw and forehead relaxed. 4. Hold for __________ seconds. 5. Release the tension slowly. Relax  your neck muscles completely before you repeat this exercise. Repeat __________ times. Complete this exercise __________ times a day. Posture and body mechanics   Body mechanics refers to the movements and positions of your body while you do your daily activities. Posture is part of body mechanics. Good posture and healthy body mechanics can help to relieve stress in your body's tissues and joints. Good posture means that your  spine is in its natural S-curve position (your spine is neutral), your shoulders are pulled back slightly, and your head is not tipped forward. The following are general guidelines for applying improved posture and body mechanics to your everyday activities. Standing  When standing, keep your spine neutral and keep your feet about hip-width apart. Keep a slight bend in your knees. Your ears, shoulders, and hips should line up.  When you do a task in which you stand in one place for a long time, place one foot up on a stable object that is 2-4 inches (5-10 cm) high, such as a footstool. This helps keep your spine neutral. Sitting  When sitting, keep your spine neutral and your keep feet flat on the floor. Use a footrest, if necessary, and keep your thighs parallel to the floor. Avoid rounding your shoulders, and avoid tilting your head forward.  When working at a desk or a computer, keep your desk at a height where your hands are slightly lower than your elbows. Slide your chair under your desk so you are close enough to maintain good posture.  When working at a computer, place your monitor at a height where you are looking straight ahead and you do not have to tilt your head forward or downward to look at the screen. Resting When lying down and resting, avoid positions that are most painful for you. Try to support your neck in a neutral position. You can use a contour pillow or a small rolled-up towel. Your pillow should support your neck but not push on it. This information is not intended to replace advice given to you by your health care provider. Make sure you discuss any questions you have with your health care provider. Document Released: 07/09/2005 Document Revised: 03/15/2016 Document Reviewed: 06/15/2015 Elsevier Interactive Patient Education  2017 [employer]

## 2016-06-25 NOTE — Progress Notes (Signed)
Subjective:    Patient ID: Brian Petty, male    DOB: Dec 16, 1965, 50 y.o.   MRN: [id number]  HPI  Brian Petty is here in follow up of his cervical myelopathy. He has been working strength and ROM but is still struggling with pain and stamina. The colder weather doesn't help. He is having problems finding a pillow and position which works for him.   His neuropathic pain has been improved except for some exacerbations from the cooler weather.   [place] the cold weather/change in season.  Pain Inventory Average Pain 5 Pain Right Now 3 My pain is sharp, burning and tingling  In the last 24 hours, has pain interfered with the following? General activity 7 Relation with others 2 Enjoyment of life 6 What TIME of day is your pain at its worst? morning, night Sleep (in general) Poor  Pain is worse with: inactivity Pain improves with: therapy/exercise Relief from Meds: 0  Mobility walk with assistance how many minutes can you walk? 30-45 ability to climb steps?  yes do you drive?  yes Do you have any goals in this area?  no  Function disabled: date disabled . Do you have any goals in this area?  no  Neuro/Psych weakness numbness tingling  Prior Studies Any changes since last visit?  no  Physicians involved in your care Any changes since last visit?  no   History reviewed. No pertinent family history. Social History   Social History  . Marital status: Single    Spouse name: N/A  . Number of children: N/A  . Years of education: N/A   Social History Main Topics  . Smoking status: Current Every Day Smoker    Packs/day: 0.50    Types: Cigarettes  . Smokeless tobacco: Never Used  . Alcohol use Yes     Comment: occasional with dinner  . Drug use: No  . Sexual activity: Not Asked   Other Topics Concern  . None   Social History Narrative  . None   Past Surgical History:  Procedure Laterality Date  .  ANTERIOR CERVICAL DECOMP/DISCECTOMY FUSION N/A 03/06/2016   Procedure: ANTERIOR CERVICAL DECOMPRESSION/DISCECTOMY FUSION CERVICAL FOUR- CERVICAL FIVE, CERVICAL FIVE- CERVICAL SIX, CERVICAL SIX- CERVICAL SEVEN;  Surgeon: Lisbeth RenshawNeelesh Nundkumar, MD;  Location: [facility];  Service: Neurosurgery;  Laterality: N/A;  ANTERIOR CERVICAL DECOMPRESSION/DISCECTOMY FUSION C4-C5, C5-C6, C6-C7   History reviewed. No pertinent past medical history. BP (!) 150/86 (BP Location: Right Arm, Patient Position: Sitting, Cuff Size: Normal)   Pulse 64   Resp 14   SpO2 99%   Opioid Risk Score:   Fall Risk Score:  `1  Depression screen PHQ 2/9  [facility] 04/24/2016 03/28/2016  Decreased Interest 0 2  Down, Depressed, Hopeless 0 2  PHQ - 2 Score 0 4  Altered sleeping - 3  Tired, decreased energy - 2  Change in appetite - 3  Feeling bad or failure about yourself  - 1  Trouble concentrating - 1  Moving slowly or fidgety/restless - 2  Suicidal thoughts - 0  PHQ-9 Score - 16  Difficult doing work/chores - Very difficult    Review of Systems  Constitutional: Positive for appetite change and unexpected weight change.  HENT: Negative.   Eyes: Negative.   Respiratory: Negative.   Cardiovascular: Negative.   Gastrointestinal: Negative.   Endocrine: Negative.   Genitourinary: Negative.   Musculoskeletal: Negative.  Allergic/Immunologic: Negative.   Neurological: Positive for weakness and numbness.       Tingling  Hematological: Negative.   Psychiatric/Behavioral: Negative.        Objective:   Physical Exam  HEENT: normocephalic.  .  Cardio: rrr Resp: CLEAR GI: BS positive and Nontender, nondistended Extremity: no edema Skin: dry   Neuro: oriented x 3,  Sensation near normal.  Motor: 5/5  bilateral deltoid, biceps, wrist extensors, 5/5 triceps, 5/5 APB, DI near normal.   B/l LE: 5/5 at the hip flexor, knee extensor, ankle dorsal flexors. Normal gait and standing  balance Musc/Skel: No edema or tenderness in extremities. Head positioning normal. rom improved although some limiations with rotation bilaterally. .  Gen. nad.   Assessment & Plan:  50 y.o.malewho was admitted on 03/04/16 after fall resulting in central cord syndrome.   1. Central cord syndrome (pt st/p decompression and ACDF C4-7).   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   -neck exercises were provided 2. Neuropathic pain/Post-operative pain--- improved    Elavil 75mg  if needed.   3. Dysphagia Resolved  4. Dizziness: improved  Follow up in 4 months. He is still not ready to return to work at the physical level required of his job.

## 2016-07-11 ENCOUNTER — Telehealth: Payer: Self-pay | Admitting: Physical Medicine & Rehabilitation

## 2016-07-11 NOTE — Telephone Encounter (Signed)
Rec'd challenge to disability forms vs. Office notes - put copy of notes/scanned media and letter in your inbox for review

## 2016-07-13 NOTE — Telephone Encounter (Signed)
Ptn delivered ltr and LOA  Paperwork - needs to pick up Wed 01.03.18 Inbox of your office

## 2016-07-13 NOTE — Telephone Encounter (Signed)
Closing encounter

## 2016-07-30 ENCOUNTER — Telehealth: Payer: Self-pay | Admitting: Physical Medicine & Rehabilitation

## 2016-07-30 NOTE — Telephone Encounter (Signed)
Katie RN from [facility] needs clarification on PT limits her number is [phone]

## 2016-08-01 NOTE — Telephone Encounter (Signed)
Consulted Dr. Riley KillSwartz regarding patient and physical therapy limits.  He says that the patient needs to be evaluated in person to these regards.  I contacted the RN at [facility] and informed that the patient will need to be seen before determination is given. Asked for call back if clarification is needed and to make new appointment at sooner time

## 2016-10-24 ENCOUNTER — Encounter: Payer: Self-pay | Admitting: Physical Medicine & Rehabilitation

## 2016-10-24 ENCOUNTER — Encounter
Payer: BLUE CROSS/BLUE SHIELD | Attending: Physical Medicine & Rehabilitation | Admitting: Physical Medicine & Rehabilitation

## 2016-10-24 VITALS — BP 134/91 | HR 93 | Resp 14

## 2016-10-24 DIAGNOSIS — G8918 Other acute postprocedural pain: Secondary | ICD-10-CM | POA: Diagnosis not present

## 2016-10-24 DIAGNOSIS — F17210 Nicotine dependence, cigarettes, uncomplicated: Secondary | ICD-10-CM | POA: Insufficient documentation

## 2016-10-24 DIAGNOSIS — X58XXXD Exposure to other specified factors, subsequent encounter: Secondary | ICD-10-CM | POA: Diagnosis not present

## 2016-10-24 DIAGNOSIS — R42 Dizziness and giddiness: Secondary | ICD-10-CM | POA: Diagnosis not present

## 2016-10-24 DIAGNOSIS — S14129D Central cord syndrome at unspecified level of cervical spinal cord, subsequent encounter: Secondary | ICD-10-CM | POA: Diagnosis not present

## 2016-10-24 DIAGNOSIS — Z981 Arthrodesis status: Secondary | ICD-10-CM | POA: Insufficient documentation

## 2016-10-24 DIAGNOSIS — G629 Polyneuropathy, unspecified: Secondary | ICD-10-CM | POA: Diagnosis not present

## 2016-10-24 DIAGNOSIS — S14109S Unspecified injury at unspecified level of cervical spinal cord, sequela: Secondary | ICD-10-CM | POA: Diagnosis not present

## 2016-10-24 NOTE — Patient Instructions (Signed)
PLEASE FEEL FREE TO CALL OUR OFFICE WITH ANY PROBLEMS OR QUESTIONS ([phone])      

## 2016-10-24 NOTE — Progress Notes (Signed)
Subjective:    Patient ID: Brian Petty, male    DOB: 1965/11/08, 51 y.o.   MRN: [id number]  HPI   Brian Petty is here in follow up of his cervical myelopathy. He is doing well. He still struggles with stamina and strength in his right arm but has noticed improvement. He would like to return to work and feels that he's ready.   He is off all pain medications. His balance is good.   He returned from a trip recently and apparently all went well.   Pain Inventory Average Pain 3 Pain Right Now 2 My pain is burning and tingling  In the last 24 hours, has pain interfered with the following? General activity 1 Relation with others 1 Enjoyment of life 1 What TIME of day is your pain at its worst? night Sleep (in general) Good  Pain is worse with: inactivity Pain improves with: pacing activities Relief from Meds: n/a  Mobility walk without assistance how many minutes can you walk? 60-120 ability to climb steps?  yes do you drive?  yes Do you have any goals in this area?  no  Function disabled: date disabled . Do you have any goals in this area?  no  Neuro/Psych No problems in this area  Prior Studies Any changes since last visit?  no  Physicians involved in your care Any changes since last visit?  no   History reviewed. No pertinent family history. Social History   Social History  . Marital status: Single    Spouse name: N/A  . Number of children: N/A  . Years of education: N/A   Social History Main Topics  . Smoking status: Current Every Day Smoker    Packs/day: 0.50    Types: Cigarettes  . Smokeless tobacco: Never Used  . Alcohol use Yes     Comment: occasional with dinner  . Drug use: No  . Sexual activity: Not Asked   Other Topics Concern  . None   Social History Narrative  . None   Past Surgical History:  Procedure Laterality Date  . ANTERIOR CERVICAL DECOMP/DISCECTOMY FUSION N/A 03/06/2016   Procedure: ANTERIOR CERVICAL DECOMPRESSION/DISCECTOMY  FUSION CERVICAL FOUR- CERVICAL FIVE, CERVICAL FIVE- CERVICAL SIX, CERVICAL SIX- CERVICAL SEVEN;  Surgeon: Lisbeth Renshaw, MD;  Location: [facility];  Service: Neurosurgery;  Laterality: N/A;  ANTERIOR CERVICAL DECOMPRESSION/DISCECTOMY FUSION C4-C5, C5-C6, C6-C7   History reviewed. No pertinent past medical history. BP (!) 134/91 (BP Location: Right Arm, Patient Position: Sitting, Cuff Size: Normal)   Pulse 93   Resp 14   SpO2 97%   Opioid Risk Score:   Fall Risk Score:  `1  Depression screen PHQ 2/9  Depression screen [facility] 2/9 04/24/2016 03/28/2016  Decreased Interest 0 2  Down, Depressed, Hopeless 0 2  PHQ - 2 Score 0 4  Altered sleeping - 3  Tired, decreased energy - 2  Change in appetite - 3  Feeling bad or failure about yourself  - 1  Trouble concentrating - 1  Moving slowly or fidgety/restless - 2  Suicidal thoughts - 0  PHQ-9 Score - 16  Difficult doing work/chores - Very difficult    Review of Systems  Constitutional: Negative.   HENT: Negative.   Eyes: Negative.   Respiratory: Negative.   Cardiovascular: Negative.   Gastrointestinal: Negative.   Endocrine: Negative.   Genitourinary: Negative.   Musculoskeletal: Negative.   Skin: Negative.   Allergic/Immunologic: Negative.   Neurological: Negative.   Hematological: Negative.  Psychiatric/Behavioral: Negative.        Objective:   Physical Exam HEENT: normocephalic. .  Cardio: RRR Resp: normal effort.  GI: BS positive and Nontender, nondistended Extremity: no edema Skin: dry   Neuro: oriented x 3,  Sensation near normal in all 4 limbs. Normal balance  Motor: 5/5  bilateral deltoid, biceps, wrist extensors, 5/5 triceps, 5/5 APB and HI.   B/l LE: 5/5 at the hip flexor, knee extensor, ankle dorsal flexors. Normal gait and standing balance Musc/Skel: No edema or tenderness in extremities. Head positioning normal. Small sublux right shoulder of 1/4". normal RTC movement.  Gen. NAD   Assessment &  Plan:     1. Central cord syndrome (pt st/p decompression and ACDF C4-7).  -follow up with NS as directed -transition back to work by the end of the month.   -I wrote him a letter for return to work 11/05/16--light duty 2. Neuropathic pain/Post-operative pain--- improved -really not using anything for pain in his arm/hand at this point.    3. Dysphagia Resolved  4. Dizziness: improved  Follow up PRN.  Fifteen minutes of face to face patient care time were spent during this visit. All questions were encouraged and answered. Greater than 50% of time during this encounter was spent counseling patient/family in regard to regarding return to work considerations, Patent examiner, etc.

## 2016-11-30 ENCOUNTER — Telehealth: Payer: Self-pay | Admitting: *Deleted

## 2016-11-30 NOTE — Telephone Encounter (Signed)
Informed patient, they will come by the clinic on Monday May 14th, 2018 to pick it up

## 2016-11-30 NOTE — Telephone Encounter (Signed)
Mr. Brian Petty left a message asking Dr. Riley KillSwartz to write a letter lifting all restrictions in regards to his employment.  Patient says he feels great and is ready to operate at 100%......Marland Kitchen.please advise

## 2016-11-30 NOTE — Telephone Encounter (Signed)
Letter written

## 2016-12-04 ENCOUNTER — Telehealth: Payer: Self-pay | Admitting: Physical Medicine & Rehabilitation

## 2016-12-04 NOTE — Telephone Encounter (Signed)
Contacted patient told him letter was printed and signed and ready for pickup

## 2016-12-04 NOTE — Telephone Encounter (Signed)
Patient is calling back about conversation he had with Rocky LinkKen.  Rocky LinkKen please call gentleman about paperwork that was supposed to left up front for him to pick up.

## 2016-12-10 ENCOUNTER — Encounter: Payer: BLUE CROSS/BLUE SHIELD | Admitting: Physical Medicine & Rehabilitation

## 2018-07-20 IMAGING — CT CT MAXILLOFACIAL W/O CM
4 of 11 series · 14 of 47 positions shown, 16 images · non-contrast
Comparison: None.

CLINICAL DATA: Initial evaluation for acute trauma, fall.

EXAM:
CT HEAD WITHOUT CONTRAST
CT MAXILLOFACIAL WITHOUT CONTRAST
CT CERVICAL SPINE WITHOUT CONTRAST
TECHNIQUE: Multidetector CT imaging of the head, cervical spine, and
maxillofacial structures were performed using the standard protocol
without intravenous contrast. Multiplanar CT image reconstructions
of the cervical spine and maxillofacial structures were also
generated.

[Series 10: facialbone 2.0 sag st · sagittal · 0.33mm/px · 1 of 113 slices shown]
[im 57/113  bone]
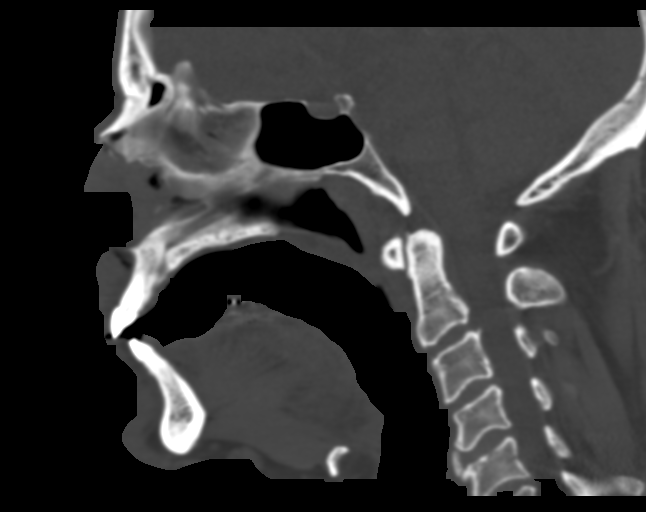

[Series 11: facialbone 2.0 cor st · coronal · 0.33mm/px · 3 of 106 slices shown]
[im 31/106  bone]
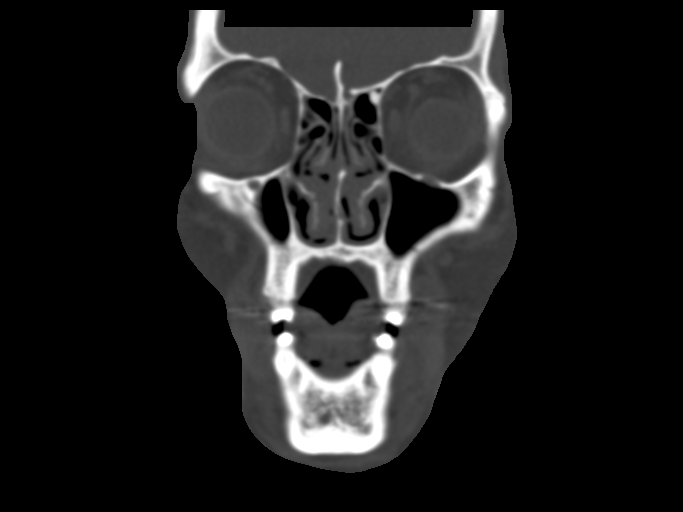
[im 46/106  bone]
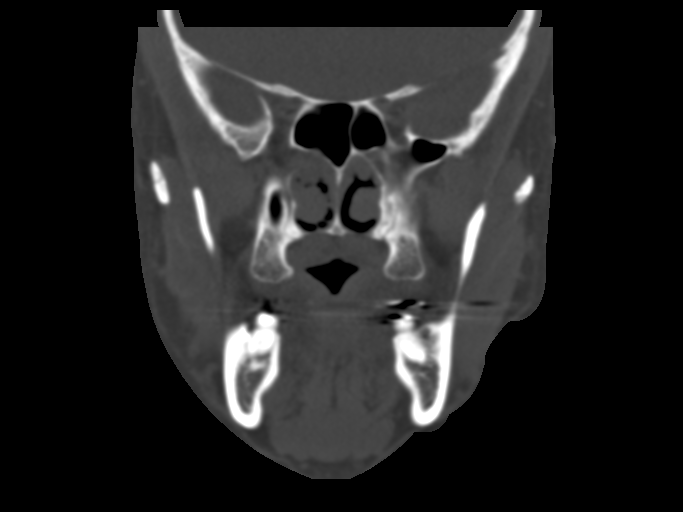
[im 61/106  bone]
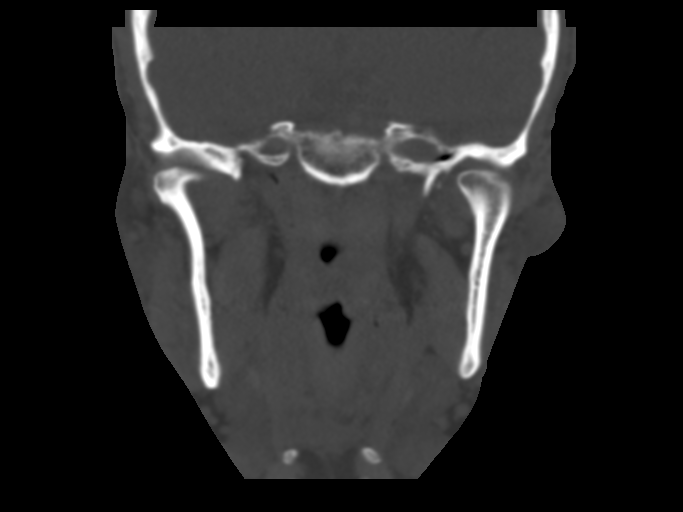

[Series 12: c_spine 2.0 st · axial · 0.36mm/px · z∈[-281,-143]mm · 6 of 97 slices shown, 8 images]
[im 14/97  brain]
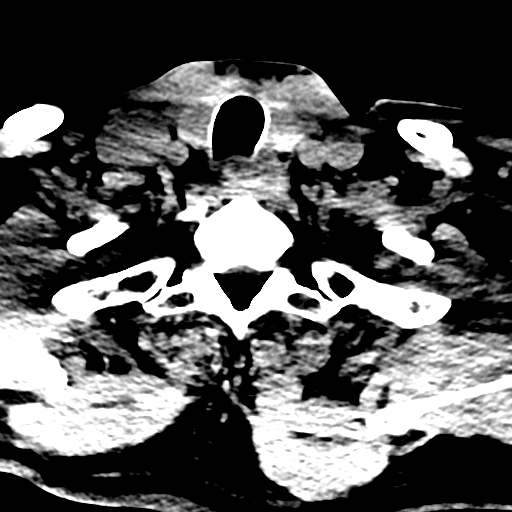
[im 14/97  bone]
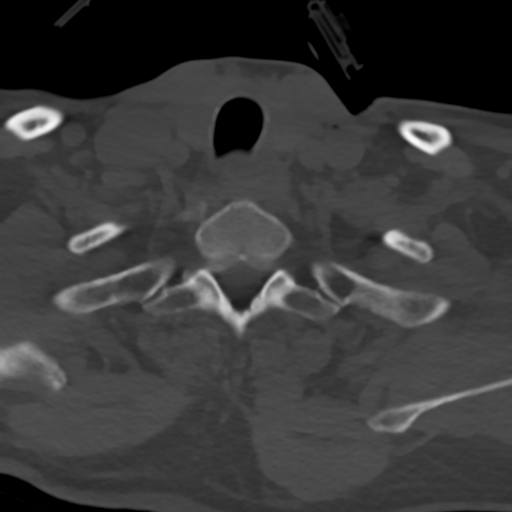
[im 28/97  bone]
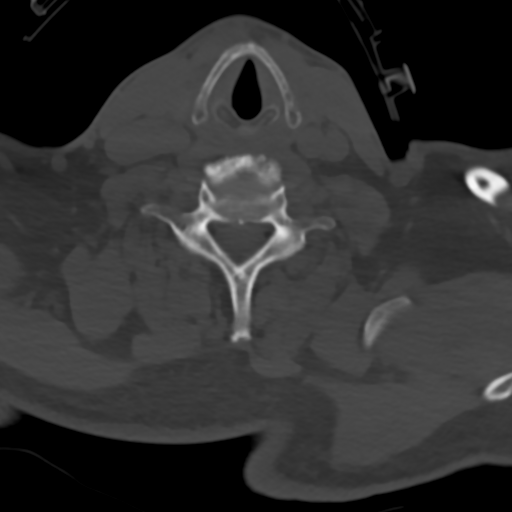
[im 42/97  bone]
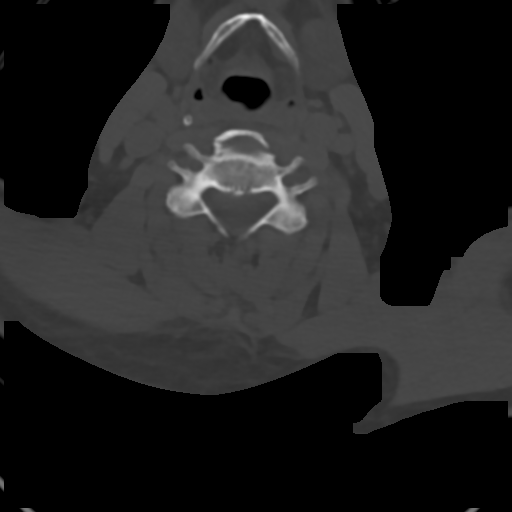
[im 55/97  bone]
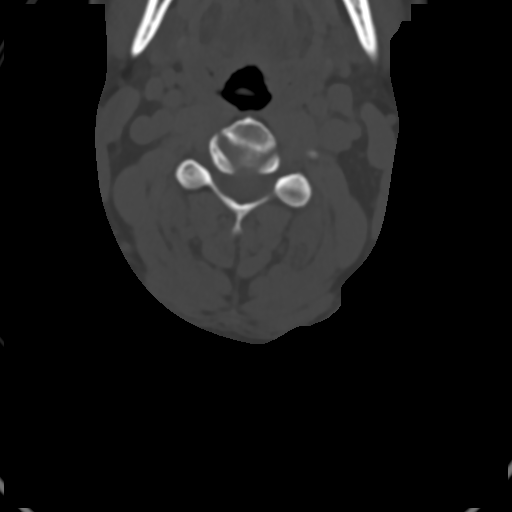
[im 69/97  brain]
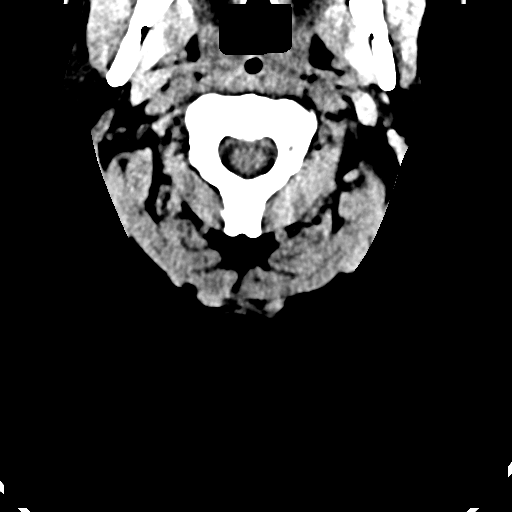
[im 69/97  bone]
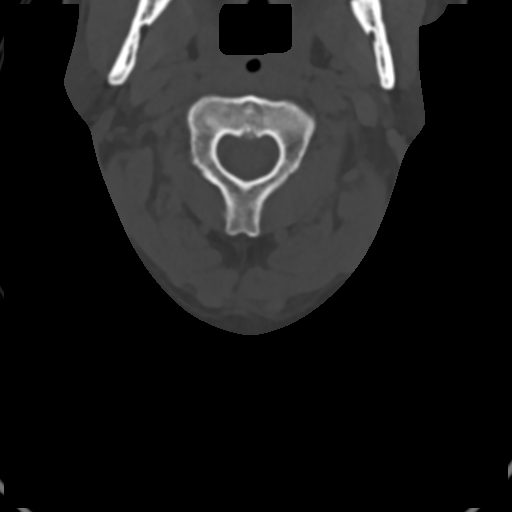
[im 83/97  bone]
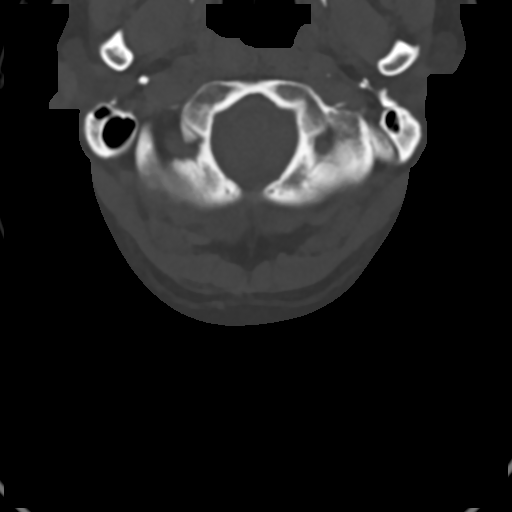

[Series 16: c_spine 2.0 orthogonals · axial · 0.27mm/px · z∈[-302,-214]mm · 4 of 96 slices shown]
[im 16/96  bone]
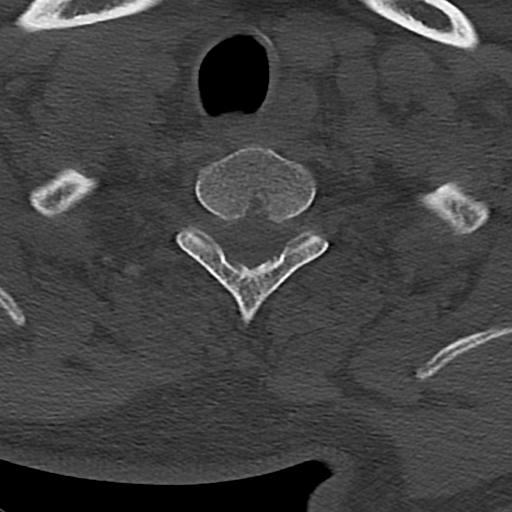
[im 32/96  bone]
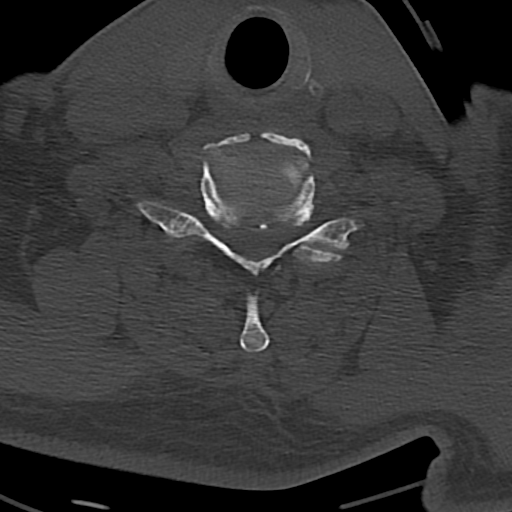
[im 48/96  bone]
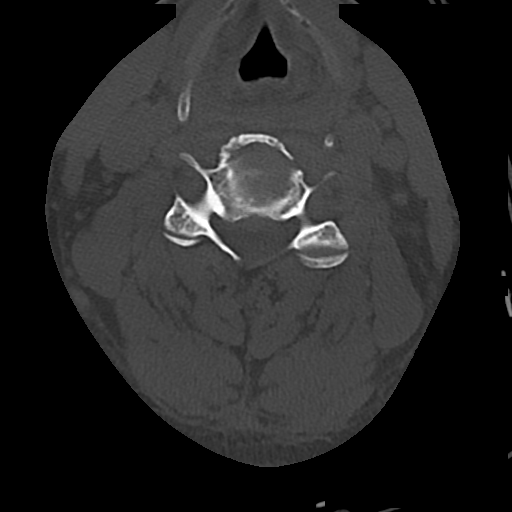
[im 64/96  bone]
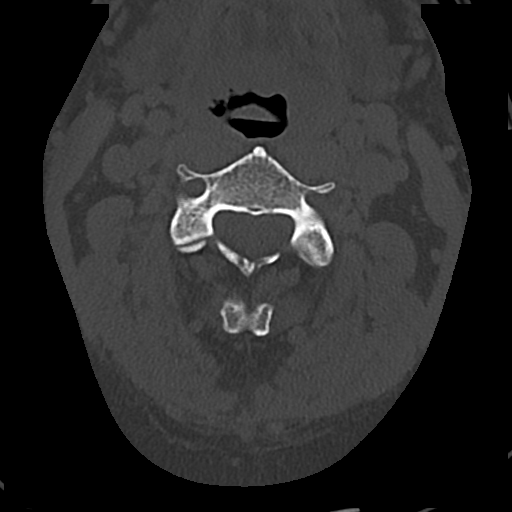

[14 of 47 positions shown; findings below may reference images not displayed]

FINDINGS: CT HEAD FINDINGS

Scalp soft tissues demonstrate no acute abnormality.

Visualized globes and orbits within normal limits.

Nasal bone fractures noted, better evaluated on concomitant
maxillofacial CT. Scattered mucosal thickening within the ethmoidal
air cells. Paranasal sinuses are otherwise clear. Trace opacity left
mastoid air cells without associated fracture. Right mastoid air
cells clear.

Calvarium intact.

Mild age-related cerebral atrophy. No acute intracranial hemorrhage
or infarct identified. No mass lesion, midline shift, or mass
effect. No hydrocephalus. No extra-axial fluid collection.

CT MAXILLOFACIAL FINDINGS

Soft tissue swelling present about the nose. No other appreciable
soft tissue swelling about the face.

Globes intact. No retro-orbital hematoma or other pathology. Bony
orbits intact without evidence orbital floor fracture.

Zygomatic arches intact. No acute maxillary fracture. Pterygoid
plates intact.

Comminuted bilateral displaced nasal bone fractures present.
Fracture of the anterior nasal septum.

Mandible intact. Mandibular condyles normally situated. No
appreciable abnormality about the dentition.

Scattered mucosal thickening within the ethmoidal air cells.
Paranasal sinuses are otherwise clear.

CT CERVICAL SPINE FINDINGS

The vertebral bodies are normally aligned with preservation of the
normal cervical lordosis. Vertebral body heights are preserved.
Normal C1-2 articulations are intact.

Small prevertebral effusion is present.

A linear lucency is seen traversing the right lateral mass of C7
(series 15, image 23). This extends through the superior articular
facet (series 14, image 22). While this could conceivably reflect a
small nutrient foramen, an acute nondisplaced fracture should be
considered com mild particularly given the presence of the small
prevertebral effusion. There is age-indeterminate lucency through a
prominent bulky osteophyte at the anterior margin of C6, favored to
be chronic. No other acute fracture.

Moderate multilevel degenerative spondylolysis extending from the
C4-5 through C6-7 levels.

Visualized lung apices are clear. No other acute soft tissue
abnormality within the neck. Vascular calcifications about the left
carotid bifurcation.
IMPRESSION: CT HEAD:

No acute intracranial process identified.

CT MAXILLOFACIAL:

1. Acute comminuted bilateral nasal bone fractures with associated
fracture of the anterior nasal septum.
2. No other acute maxillofacial injury identified.

CT CERVICAL SPINE:

1. Linear lucency traversing the right lateral mass of C7, extending
to the superior articular facet. While this finding could
conceivably reflect a small nutrient foramen, possible acute
nondisplaced fracture should also be considered. Correlation with
physical exam for possible pain in this location recommended.
2. Small prevertebral effusion.
3. No other definite acute traumatic injury within the cervical
spine.
4. Moderate multilevel degenerative spondylolysis from C4-5 through
C6-7.
